# Patient Record
Sex: Male | Born: 1975 | Race: White | Hispanic: No | Marital: Single | Smoking: Current every day smoker
Health system: Southern US, Community
[De-identification: ages and names within clinical notes are randomized; demographics above are authoritative.]

## PROBLEM LIST (undated history)

## (undated) DIAGNOSIS — Z720 Tobacco use: Secondary | ICD-10-CM

## (undated) DIAGNOSIS — I2699 Other pulmonary embolism without acute cor pulmonale: Secondary | ICD-10-CM

## (undated) HISTORY — DX: Tobacco use: Z72.0

## (undated) HISTORY — DX: Other pulmonary embolism without acute cor pulmonale: I26.99

---

## 2019-03-11 ENCOUNTER — Telehealth: Payer: Self-pay | Admitting: Internal Medicine

## 2019-03-11 ENCOUNTER — Other Ambulatory Visit: Payer: Self-pay | Admitting: Internal Medicine

## 2019-03-11 ENCOUNTER — Other Ambulatory Visit: Payer: Self-pay

## 2019-03-11 ENCOUNTER — Encounter: Payer: Self-pay | Admitting: Internal Medicine

## 2019-03-11 ENCOUNTER — Ambulatory Visit: Payer: 59 | Admitting: Internal Medicine

## 2019-03-11 VITALS — BP 110/70 | HR 76 | Temp 97.7°F | Ht 72.0 in | Wt 256.7 lb

## 2019-03-11 DIAGNOSIS — E559 Vitamin D deficiency, unspecified: Secondary | ICD-10-CM

## 2019-03-11 DIAGNOSIS — E669 Obesity, unspecified: Secondary | ICD-10-CM | POA: Diagnosis not present

## 2019-03-11 DIAGNOSIS — I2699 Other pulmonary embolism without acute cor pulmonale: Secondary | ICD-10-CM | POA: Diagnosis not present

## 2019-03-11 DIAGNOSIS — Z720 Tobacco use: Secondary | ICD-10-CM | POA: Insufficient documentation

## 2019-03-11 LAB — CBC WITH DIFFERENTIAL/PLATELET
Basophils Absolute: 0.1 10*3/uL (ref 0.0–0.1)
Basophils Relative: 1.4 % (ref 0.0–3.0)
Eosinophils Absolute: 0.2 10*3/uL (ref 0.0–0.7)
Eosinophils Relative: 3.9 % (ref 0.0–5.0)
HCT: 45.8 % (ref 39.0–52.0)
Hemoglobin: 15.6 g/dL (ref 13.0–17.0)
Lymphocytes Relative: 35 % (ref 12.0–46.0)
Lymphs Abs: 2.1 10*3/uL (ref 0.7–4.0)
MCHC: 34.2 g/dL (ref 30.0–36.0)
MCV: 88.6 fl (ref 78.0–100.0)
Monocytes Absolute: 0.5 10*3/uL (ref 0.1–1.0)
Monocytes Relative: 7.7 % (ref 3.0–12.0)
Neutro Abs: 3.1 10*3/uL (ref 1.4–7.7)
Neutrophils Relative %: 52 % (ref 43.0–77.0)
Platelets: 139 10*3/uL — ABNORMAL LOW (ref 150.0–400.0)
RBC: 5.17 Mil/uL (ref 4.22–5.81)
RDW: 13.2 % (ref 11.5–15.5)
WBC: 6 10*3/uL (ref 4.0–10.5)

## 2019-03-11 LAB — COMPREHENSIVE METABOLIC PANEL
ALT: 48 U/L (ref 0–53)
AST: 20 U/L (ref 0–37)
Albumin: 4.4 g/dL (ref 3.5–5.2)
Alkaline Phosphatase: 75 U/L (ref 39–117)
BUN: 18 mg/dL (ref 6–23)
CO2: 26 mEq/L (ref 19–32)
Calcium: 8.7 mg/dL (ref 8.4–10.5)
Chloride: 105 mEq/L (ref 96–112)
Creatinine, Ser: 0.91 mg/dL (ref 0.40–1.50)
GFR: 90.55 mL/min (ref >60.00)
Glucose, Bld: 97 mg/dL (ref 70–99)
Potassium: 4 mEq/L (ref 3.5–5.1)
Sodium: 141 mEq/L (ref 135–145)
Total Bilirubin: 0.4 mg/dL (ref 0.2–1.2)
Total Protein: 6.5 g/dL (ref 6.0–8.3)

## 2019-03-11 LAB — LIPID PANEL
Cholesterol: 157 mg/dL (ref 0–200)
HDL: 40.5 mg/dL (ref >39.00)
LDL Cholesterol: 96 mg/dL (ref 0–99)
NonHDL: 116.73
Total CHOL/HDL Ratio: 4
Triglycerides: 103 mg/dL (ref 0.0–149.0)
VLDL: 20.6 mg/dL (ref 0.0–40.0)

## 2019-03-11 LAB — HEMOGLOBIN A1C: Hgb A1c MFr Bld: 6 % (ref 4.6–6.5)

## 2019-03-11 LAB — VITAMIN B12: Vitamin B-12: 384 pg/mL (ref 211–911)

## 2019-03-11 LAB — VITAMIN D 25 HYDROXY (VIT D DEFICIENCY, FRACTURES): VITD: 20.4 ng/mL — ABNORMAL LOW (ref 30.00–100.00)

## 2019-03-11 LAB — TSH: TSH: 3.97 u[IU]/mL (ref 0.35–4.50)

## 2019-03-11 MED ORDER — ELIQUIS 5 MG PO TABS: 5.0000 mg | ORAL_TABLET | Freq: Two times a day (BID) | ORAL | 2 refills | Status: DC

## 2019-03-11 MED ORDER — BUPROPION HCL ER (XL) 150 MG PO TB24: 150.0000 mg | ORAL_TABLET | Freq: Every day | ORAL | 2 refills | Status: DC

## 2019-03-11 MED ORDER — VITAMIN D (ERGOCALCIFEROL) 1.25 MG (50000 UNIT) PO CAPS: 50000.0000 [IU] | ORAL_CAPSULE | ORAL | 0 refills | Status: AC

## 2019-03-11 NOTE — Telephone Encounter (Signed)
Left detailed message on machine for patient that samples are available and coupons. Prior Auth will be started.

## 2019-03-11 NOTE — Telephone Encounter (Signed)
Patient needs PA on Eloquis. Insurance won't cover it.  He also wants Dr. Jerilee Hoh to know the original perscriber did not write it for the amount they said they would so he only has enough left to last until this Friday 03/13/19. With that said he's like a call from the nurse to get advice on what he is supposed to do without this medication because he was told it's urgent he is on the medication without interruption.

## 2019-03-11 NOTE — Progress Notes (Signed)
New Patient Office Visit     CC/Reason for Visit: Establish care, discuss medical issues Previous PCP: Unknown Last Visit: None recent  HPI: Caleb Arias is a 43 y.o. male who is coming in today for the above mentioned reasons.  He has not had routine medical care.  He is a Agricultural consultantlong-distance truck driver and last week had an episode of mild chest pain, shortness of breath and dizziness while driving which prompted a visit to a hospital in [place].  I do not have full hospital records, but he does bring in a scanned report on his cell phone that denotes the diagnosis of PE.  He was started on Eliquis 10 mg twice daily for 7 days, he is on day #6, with plans to transition to 5 mg twice daily at day 8.  Other than this he has a history of tobacco abuse and smokes 1 to 1-1/2 packs/day and is interested in cessation.  He is not aware of any other medical history of significance.  His family history is significant for a father with diabetes and a maternal grandmother that passed away years ago due to unknown primary cancer.  He has no acute complaints today.   Past Medical/Surgical History: Past Medical History:  Diagnosis Date  . Pulmonary emboli (HCC)   . Tobacco abuse     History reviewed. No pertinent surgical history.  Social History:  reports that he has been smoking cigarettes. He has been smoking about 1.50 packs per day. He has never used smokeless tobacco. He reports current alcohol use. No history on file for drug.  Allergies: No Known Allergies  Family History:  Father with diabetes  Current Outpatient Medications:  .  buPROPion (WELLBUTRIN XL) 150 MG 24 hr tablet, Take 1 tablet (150 mg total) by mouth daily., Disp: 30 tablet, Rfl: 2 .  ELIQUIS 5 MG TABS tablet, Take 1 tablet (5 mg total) by mouth 2 (two) times daily., Disp: 60 tablet, Rfl: 2  Review of Systems:  Constitutional: Denies fever, chills, diaphoresis, appetite change and fatigue.  HEENT: Denies photophobia, eye  pain, redness, hearing loss, ear pain, congestion, sore throat, rhinorrhea, sneezing, mouth sores, trouble swallowing, neck pain, neck stiffness and tinnitus.   Respiratory: Denies SOB, DOE, cough, chest tightness,  and wheezing.   Cardiovascular: Denies chest pain, palpitations and leg swelling.  Gastrointestinal: Denies nausea, vomiting, abdominal pain, diarrhea, constipation, blood in stool and abdominal distention.  Genitourinary: Denies dysuria, urgency, frequency, hematuria, flank pain and difficulty urinating.  Endocrine: Denies: hot or cold intolerance, sweats, changes in hair or nails, polyuria, polydipsia. Musculoskeletal: Denies myalgias, back pain, joint swelling, arthralgias and gait problem.  Skin: Denies pallor, rash and wound.  Neurological: Denies dizziness, seizures, syncope, weakness, light-headedness, numbness and headaches.  Hematological: Denies adenopathy. Easy bruising, personal or family bleeding history  Psychiatric/Behavioral: Denies suicidal ideation, mood changes, confusion, nervousness, sleep disturbance and agitation    Physical Exam: Vitals:   03/11/19 0656  BP: 110/70  Pulse: 76  Temp: 97.7 F (36.5 C)  TempSrc: Temporal  SpO2: 96%  Weight: 256 lb 11.2 oz (116.4 kg)  Height: 6' (1.829 m)   Body mass index is 34.81 kg/m.  Constitutional: NAD, calm, comfortable Eyes: PERRL, lids and conjunctivae normal ENMT: Mucous membranes are moist.  Neck: normal, supple, no masses, no thyromegaly Respiratory: clear to auscultation bilaterally, no wheezing, no crackles. Normal respiratory effort. No accessory muscle use.  Cardiovascular: Regular rate and rhythm, no murmurs / rubs / gallops. No  extremity edema. 2+ pedal pulses. No carotid bruits.  Abdomen: no tenderness, no masses palpated. No hepatosplenomegaly. Bowel sounds positive.  Musculoskeletal: no clubbing / cyanosis. No joint deformity upper and lower extremities. Good ROM, no contractures. Normal muscle  tone.  Skin: no rashes, lesions, ulcers. No induration Neurologic: Grossly intact and nonfocal  psychiatric: Normal judgment and insight. Alert and oriented x 3. Normal mood.    Impression and Plan:  Pulmonary embolism and infarction [facility])  -This was a provoked episode: Tobacco abuse, obesity and sedentary job (long-distance truck driving) or his risk factors. -Agree with scaling down Eliquis dose to 5 mg twice daily on day 8, refills have been given today. -He will be on blood thinners for a minimum of 12 months.  Tobacco abuse  I have discussed tobacco cessation with the patient.  I have counseled the patient regarding the negative impacts of continued tobacco use including but not limited to lung cancer, COPD, and cardiovascular disease.  I have discussed alternatives to tobacco and modalities that may help facilitate tobacco cessation including but not limited to biofeedback, hypnosis, and medications.  Total time spent with tobacco counseling was 4 minutes. -He has agreed to start Wellbutrin, we will follow-up with him in 6 months, CBT sessions have been recommended and he will consider.   Obesity (BMI 30.0-34.9)  -Discussed healthy lifestyle, including increased physical activity and better food choices to promote weight loss.     Patient Instructions  -Nice meeting you today!!  -Lab work today; will notify you once results are available.  START Wellbutrin 150 mg daily for smoking cessation.  At day 8, drop Eliquis dose to 5 mg twice a day.  -Schedule follow up in 6 weeks.  -Consider arranging CBT with Dennison Bulla in our office.     Caleb Frohlich, MD [facility]

## 2019-03-11 NOTE — Telephone Encounter (Signed)
Pt returned call.  Please call pt:  [phone]

## 2019-03-11 NOTE — Patient Instructions (Signed)
-  Nice meeting you today!!  -Lab work today; will notify you once results are available.  START Wellbutrin 150 mg daily for smoking cessation.  At day 8, drop Eliquis dose to 5 mg twice a day.  -Schedule follow up in 6 weeks.  -Consider arranging CBT with Dennison Bulla in our office.

## 2019-03-11 NOTE — Telephone Encounter (Signed)
Spoke with patient and he spoke with his insurance company and a 30 day was approved.  Patient will call back next month if a prior auth is needed.

## 2019-03-12 ENCOUNTER — Telehealth: Payer: Self-pay | Admitting: Internal Medicine

## 2019-03-12 NOTE — Telephone Encounter (Signed)
Copied from [place] [id number]. Topic: General - Other >> Mar 12, 2019  2:47 PM Arias, Caleb wrote: Reason for CRM: Pt would like to know if the clearance form for him to return back to work was received. Pt requests call back.

## 2019-03-13 NOTE — Telephone Encounter (Signed)
Letter sent to Mychart and patient is aware.

## 2019-03-13 NOTE — Telephone Encounter (Signed)
Please call pt back about whether the form was received

## 2019-03-16 ENCOUNTER — Encounter: Payer: Self-pay | Admitting: Internal Medicine

## 2019-04-16 ENCOUNTER — Encounter: Payer: Self-pay | Admitting: Internal Medicine

## 2019-04-16 ENCOUNTER — Ambulatory Visit: Payer: 59 | Admitting: Internal Medicine

## 2019-04-16 ENCOUNTER — Other Ambulatory Visit: Payer: Self-pay

## 2019-04-16 VITALS — BP 130/90 | HR 72 | Temp 98.8°F | Wt 256.0 lb

## 2019-04-16 DIAGNOSIS — Z23 Encounter for immunization: Secondary | ICD-10-CM | POA: Diagnosis not present

## 2019-04-16 DIAGNOSIS — R079 Chest pain, unspecified: Secondary | ICD-10-CM

## 2019-04-16 DIAGNOSIS — R002 Palpitations: Secondary | ICD-10-CM | POA: Diagnosis not present

## 2019-04-16 NOTE — Patient Instructions (Signed)
-  Nice seeing you today!!  -We will arrange for you to see cardiology.

## 2019-04-16 NOTE — Progress Notes (Signed)
Established Patient Office Visit     CC/Reason for Visit: Episode of palpitations and lightheadedness  HPI: Caleb Arias is a 43 y.o. male who is coming in today for the above mentioned reasons. Past Medical History is significant for: Obesity, tobacco abuse and a right upper lobe pulmonary embolism diagnosed in August who is on Eliquis.  I met him 3 weeks ago.  At that time he was started on Wellbutrin for smoking cessation and has been able to cut back from 2 packs a day to 10 cigarettes a week.  Yesterday he was at home watching TV when suddenly he had mild left precordial discomfort without radiation.  He had palpitations and felt dizzy when he took his heart rate it was around 1 25-1 30.  Denies shortness of breath, cough.  He went to walk his dog thinking that that would help.  When he returned and he was still having the palpitations he decided to call 911.  911 did not recommend hospital transport.  He brings in his strips that show sinus tachycardia with a rate of 107 but otherwise no acute changes.  He states he has had 3 of these episodes in the last 3 months or so.  He takes his Eliquis religiously at 9:00 in the morning and 9 in the evening.   Past Medical/Surgical History: Past Medical History:  Diagnosis Date  . Pulmonary emboli ([place])   . Tobacco abuse     No past surgical history on file.  Social History:  reports that he has been smoking cigarettes. He has been smoking about 1.50 packs per day. He has never used smokeless tobacco. He reports current alcohol use. No history on file for drug.  Allergies: No Known Allergies  Family History:  No history of heart disease, cancer, stroke that he is aware of.  Current Outpatient Medications:  .  buPROPion (WELLBUTRIN XL) 150 MG 24 hr tablet, Take 1 tablet (150 mg total) by mouth daily., Disp: 30 tablet, Rfl: 2 .  ELIQUIS 5 MG TABS tablet, Take 1 tablet (5 mg total) by mouth 2 (two) times daily., Disp: 60 tablet, Rfl:  2 .  Vitamin D, Ergocalciferol, (DRISDOL) 1.25 MG (50000 UT) CAPS capsule, Take 1 capsule (50,000 Units total) by mouth every 7 (seven) days for 12 doses., Disp: 12 capsule, Rfl: 0  Review of Systems:  Constitutional: Denies fever, chills, diaphoresis, appetite change and fatigue.  HEENT: Denies photophobia, eye pain, redness, hearing loss, ear pain, congestion, sore throat, rhinorrhea, sneezing, mouth sores, trouble swallowing, neck pain, neck stiffness and tinnitus.   Respiratory: Denies SOB, DOE, cough, chest tightness,  and wheezing.   Cardiovascular: Denies leg swelling.  Gastrointestinal: Denies nausea, vomiting, abdominal pain, diarrhea, constipation, blood in stool and abdominal distention.  Genitourinary: Denies dysuria, urgency, frequency, hematuria, flank pain and difficulty urinating.  Endocrine: Denies: hot or cold intolerance, sweats, changes in hair or nails, polyuria, polydipsia. Musculoskeletal: Denies myalgias, back pain, joint swelling, arthralgias and gait problem.  Skin: Denies pallor, rash and wound.  Neurological: Denies dizziness, seizures, syncope, weakness,  numbness and headaches.  Hematological: Denies adenopathy. Easy bruising, personal or family bleeding history  Psychiatric/Behavioral: Denies suicidal ideation, mood changes, confusion, nervousness, sleep disturbance and agitation    Physical Exam: Vitals:   04/16/19 1534  BP: 130/90  Pulse: 72  Temp: 98.8 F (37.1 C)  TempSrc: Temporal  SpO2: 95%  Weight: 256 lb (116.1 kg)    Body mass index is 34.72 kg/m.  Constitutional: NAD, calm, comfortable Eyes: PERRL, lids and conjunctivae normal ENMT: Mucous membranes are moist.  Respiratory: clear to auscultation bilaterally, no wheezing, no crackles. Normal respiratory effort. No accessory muscle use.  Cardiovascular: Regular rate and rhythm, no murmurs / rubs / gallops. No extremity edema. 2+ pedal pulses. No carotid bruits.  Abdomen: no tenderness,  no masses palpated. No hepatosplenomegaly. Bowel sounds positive.  Musculoskeletal: no clubbing / cyanosis. No joint deformity upper and lower extremities. Good ROM, no contractures. Normal muscle tone.  Neurologic: CN 2-12 grossly intact. Sensation intact, DTR normal. Strength 5/5 in all 4.  Psychiatric: Normal judgment and insight. Alert and oriented x 3. Normal mood.    Impression and Plan:  Chest pain, unspecified type  Palpitations  -EKG done in office today and interpreted by myself as: Normal sinus rhythm with a rate of 65, normal axis, no acute ST or T wave changes. -With the episodes of palpitations and documented elevated heart rate, I think it would be worthwhile to consider an event monitor to evaluate for arrhythmias.  He is also obese and with significant tobacco history so coronary artery disease is also possible. -Will initiate cardiology referral today. -He is currently chest pain-free.    Patient Instructions  -Nice seeing you today!!  -We will arrange for you to see cardiology.       Lelon Frohlich, MD [facility] at [facility]

## 2019-04-20 ENCOUNTER — Encounter: Payer: Self-pay | Admitting: Internal Medicine

## 2019-04-21 ENCOUNTER — Emergency Department (HOSPITAL_COMMUNITY): Payer: 59

## 2019-04-21 ENCOUNTER — Encounter (HOSPITAL_COMMUNITY): Payer: Self-pay

## 2019-04-21 ENCOUNTER — Emergency Department (HOSPITAL_COMMUNITY)
Admission: EM | Admit: 2019-04-21 | Discharge: 2019-04-22 | Disposition: A | Discharge status: Home / Self Care | Payer: 59 | Attending: Emergency Medicine | Admitting: Emergency Medicine

## 2019-04-21 DIAGNOSIS — Z79899 Other long term (current) drug therapy: Secondary | ICD-10-CM | POA: Diagnosis not present

## 2019-04-21 DIAGNOSIS — F17210 Nicotine dependence, cigarettes, uncomplicated: Secondary | ICD-10-CM | POA: Insufficient documentation

## 2019-04-21 DIAGNOSIS — Z7901 Long term (current) use of anticoagulants: Secondary | ICD-10-CM | POA: Insufficient documentation

## 2019-04-21 DIAGNOSIS — R0789 Other chest pain: Secondary | ICD-10-CM | POA: Diagnosis not present

## 2019-04-21 DIAGNOSIS — R0602 Shortness of breath: Secondary | ICD-10-CM | POA: Diagnosis present

## 2019-04-21 LAB — I-STAT CREATININE, ED: Creatinine, Ser: 1 mg/dL (ref 0.61–1.24)

## 2019-04-21 LAB — CBC WITH DIFFERENTIAL/PLATELET
Abs Immature Granulocytes: 0.01 10*3/uL (ref 0.00–0.07)
Basophils Absolute: 0.1 10*3/uL (ref 0.0–0.1)
Basophils Relative: 1 %
Eosinophils Absolute: 0.2 10*3/uL (ref 0.0–0.5)
Eosinophils Relative: 3 %
HCT: 45.2 % (ref 39.0–52.0)
Hemoglobin: 16.1 g/dL (ref 13.0–17.0)
Immature Granulocytes: 0 %
Lymphocytes Relative: 30 %
Lymphs Abs: 2.3 10*3/uL (ref 0.7–4.0)
MCH: 31.2 pg (ref 26.0–34.0)
MCHC: 35.6 g/dL (ref 30.0–36.0)
MCV: 87.6 fL (ref 80.0–100.0)
Monocytes Absolute: 0.4 10*3/uL (ref 0.1–1.0)
Monocytes Relative: 5 %
Neutro Abs: 4.7 10*3/uL (ref 1.7–7.7)
Neutrophils Relative %: 61 %
Platelets: 184 10*3/uL (ref 150–400)
RBC: 5.16 MIL/uL (ref 4.22–5.81)
RDW: 12.4 % (ref 11.5–15.5)
WBC: 7.7 10*3/uL (ref 4.0–10.5)
nRBC: 0 % (ref 0.0–0.2)

## 2019-04-21 LAB — BASIC METABOLIC PANEL
Anion gap: 8 (ref 5–15)
BUN: 17 mg/dL (ref 6–20)
CO2: 24 mmol/L (ref 22–32)
Calcium: 8.7 mg/dL — ABNORMAL LOW (ref 8.9–10.3)
Chloride: 103 mmol/L (ref 98–111)
Creatinine, Ser: 1.06 mg/dL (ref 0.61–1.24)
GFR calc Af Amer: >60 mL/min (ref >60)
GFR calc non Af Amer: >60 mL/min (ref >60)
Glucose, Bld: 124 mg/dL — ABNORMAL HIGH (ref 70–99)
Potassium: 5.7 mmol/L — ABNORMAL HIGH (ref 3.5–5.1)
Sodium: 135 mmol/L (ref 135–145)

## 2019-04-21 LAB — BRAIN NATRIURETIC PEPTIDE: B Natriuretic Peptide: 18.7 pg/mL (ref 0.0–100.0)

## 2019-04-21 LAB — TROPONIN I (HIGH SENSITIVITY): Troponin I (High Sensitivity): 3 ng/L (ref <18)

## 2019-04-21 MED ORDER — IOHEXOL 350 MG/ML SOLN
75.0000 mL | Freq: Once | INTRAVENOUS | Status: AC | PRN
  Administered 2019-04-21: 75 mL via INTRAVENOUS

## 2019-04-21 MED ORDER — ALUM & MAG HYDROXIDE-SIMETH 200-200-20 MG/5ML PO SUSP
30.0000 mL | Freq: Once | ORAL | Status: AC
  Administered 2019-04-22: 30 mL via ORAL
  Filled 2019-04-21: qty 30

## 2019-04-21 NOTE — ED Provider Notes (Signed)
[facility] EMERGENCY DEPARTMENT Provider Note   CSN: [id number] Arrival date & time: 04/21/19  2151     History   Chief Complaint Chief Complaint  Patient presents with   Shortness of Breath    HPI Caleb Arias is a 43 y.o. male.     43 yo M with a chief complaints of chest discomfort.  He states this comes in waves.  He feels a wave buildup in the upper part of his abdomen and go up to his entire chest.  Usually lasts for about an hour he feels it going to both of his arms and he has trouble breathing with this.  He has had about 4 episodes of this.  Usually resolves when EMS arrived.  He went to a hospital in [facility] and was diagnosed with a pulmonary embolism.  They told him it was a small 1 and started him on anticoagulation.  He has been taking Eliquis regularly.  Unfortunately over the past week or so he has had these more frequently.  States they seem to happen about once every night somewhere around 9 PM.  Usually is at rest when these occur.  He had another episode this evening that concerned him and so he came to the emergency department.  Feels that his symptoms have resolved though he has a little bit of a mild ache to the left side of his chest.  The ache has not been there previously.  He denies any exertional symptoms.  Denies history of MI.  The history is provided by the patient.  Shortness of Breath Severity:  Moderate Onset quality:  Gradual Duration:  1 month Timing:  Intermittent Progression:  Waxing and waning Chronicity:  New Relieved by:  Nothing Worsened by:  Nothing Ineffective treatments:  None tried Associated symptoms: chest pain   Associated symptoms: no abdominal pain, no fever, no headaches, no rash and no vomiting     Past Medical History:  Diagnosis Date   Pulmonary emboli (HCC)    Tobacco abuse     Patient Active Problem List   Diagnosis Date Noted   Obesity (BMI 30.0-34.9) 03/11/2019   Vitamin D  deficiency 03/11/2019   Tobacco abuse     No past surgical history on file.      Home Medications    Prior to Admission medications   Medication Sig Start Date End Date Taking? Authorizing Provider  buPROPion (WELLBUTRIN XL) 150 MG 24 hr tablet Take 1 tablet (150 mg total) by mouth daily. 03/11/19   Philip Aspen, Limmie Patricia, MD  ELIQUIS 5 MG TABS tablet Take 1 tablet (5 mg total) by mouth 2 (two) times daily. 03/11/19   Philip Aspen, Limmie Patricia, MD  Vitamin D, Ergocalciferol, (DRISDOL) 1.25 MG (50000 UT) CAPS capsule Take 1 capsule (50,000 Units total) by mouth every 7 (seven) days for 12 doses. 03/11/19 05/28/19  Henderson Cloud, MD    Family History No family history on file.  Social History Social History   Tobacco Use   Smoking status: Current Every Day Smoker    Packs/day: 1.50    Types: Cigarettes   Smokeless tobacco: Never Used  Substance Use Topics   Alcohol use: Yes   Drug use: Not on file     Allergies   Patient has no known allergies.   Review of Systems Review of Systems  Constitutional: Negative for chills and fever.  HENT: Negative for congestion and facial swelling.   Eyes: Negative for  discharge and visual disturbance.  Respiratory: Positive for shortness of breath.   Cardiovascular: Positive for chest pain. Negative for palpitations.  Gastrointestinal: Negative for abdominal pain, diarrhea and vomiting.  Musculoskeletal: Negative for arthralgias and myalgias.  Skin: Negative for color change and rash.  Neurological: Negative for tremors, syncope and headaches.  Psychiatric/Behavioral: Negative for confusion and dysphoric mood.     Physical Exam Updated Vital Signs BP (!) 161/74    Pulse (!) 49    Temp 98.3 F (36.8 C) (Oral)    Resp (!) 22    Ht 5\' 11"  (1.803 m)    Wt 115.7 kg    SpO2 97%    BMI 35.57 kg/m   Physical Exam Vitals signs and nursing note reviewed.  Constitutional:      Appearance: He is well-developed.    HENT:     Head: Normocephalic and atraumatic.  Eyes:     Pupils: Pupils are equal, round, and reactive to light.  Neck:     Musculoskeletal: Normal range of motion and neck supple.     Vascular: No JVD.  Cardiovascular:     Rate and Rhythm: Normal rate and regular rhythm.     Heart sounds: No murmur. No friction rub. No gallop.   Pulmonary:     Effort: No respiratory distress.     Breath sounds: No wheezing.  Abdominal:     General: There is no distension.     Tenderness: There is no guarding or rebound.  Musculoskeletal: Normal range of motion.  Skin:    Coloration: Skin is not pale.     Findings: No rash.  Neurological:     Mental Status: He is alert and oriented to person, place, and time.  Psychiatric:        Behavior: Behavior normal.      ED Treatments / Results  Labs (all labs ordered are listed, but only abnormal results are displayed) Labs Reviewed  BASIC METABOLIC PANEL - Abnormal; Notable for the following components:      Result Value   Potassium 5.7 (*)    Glucose, Bld 124 (*)    Calcium 8.7 (*)    All other components within normal limits  CBC WITH DIFFERENTIAL/PLATELET  BRAIN NATRIURETIC PEPTIDE  POTASSIUM  I-STAT CREATININE, ED  TROPONIN I (HIGH SENSITIVITY)  TROPONIN I (HIGH SENSITIVITY)    EKG EKG Interpretation  Date/Time:  Tuesday April 21 2019 21:57:36 EDT Ventricular Rate:  84 PR Interval:    QRS Duration: 102 QT Interval:  368 QTC Calculation: 435 R Axis:   68 Text Interpretation:  Sinus rhythm No old tracing to compare Confirmed by Deno Etienne [phone]) on 04/21/2019 10:21:10 PM   Radiology Dg Chest 2 View  Result Date: 04/21/2019 CLINICAL DATA:  43 year old male with shortness of breath and chest pain. EXAM: CHEST - 2 VIEW COMPARISON:  None. FINDINGS: The heart size and mediastinal contours are within normal limits. Both lungs are clear. The visualized skeletal structures are unremarkable. IMPRESSION: No active cardiopulmonary  disease. Electronically Signed   By: Anner Crete M.D.   On: 04/21/2019 22:47   Ct Angio Chest Pe W And/or Wo Contrast  Result Date: 04/21/2019 CLINICAL DATA:  43 year old male with shortness of breath and chest discomfort. EXAM: CT ANGIOGRAPHY CHEST WITH CONTRAST TECHNIQUE: Multidetector CT imaging of the chest was performed using the standard protocol during bolus administration of intravenous contrast. Multiplanar CT image reconstructions and MIPs were obtained to evaluate the vascular anatomy. CONTRAST:  19mL OMNIPAQUE  IOHEXOL 350 MG/ML SOLN COMPARISON:  Chest radiograph dated 09/22 FINDINGS: Cardiovascular: There is no cardiomegaly or pericardial effusion. The thoracic aorta is unremarkable. There is no CT evidence of pulmonary embolism. Mediastinum/Nodes: No hilar or mediastinal adenopathy. The esophagus and the thyroid gland are grossly unremarkable. No mediastinal fluid collection. Lungs/Pleura: No focal consolidation, pleural effusion, pneumothorax. The central airways are patent. Upper Abdomen: Diffuse fatty infiltration of the liver. The visualized upper abdomen is otherwise unremarkable. Musculoskeletal: No chest wall abnormality. No acute or significant osseous findings. Review of the MIP images confirms the above findings. IMPRESSION: No acute intrathoracic pathology. No CT evidence of pulmonary embolism. Electronically Signed   By: Elgie Collard M.D.   On: 04/21/2019 23:15    Procedures Procedures (including critical care time)  Medications Ordered in ED Medications  alum & mag hydroxide-simeth (MAALOX/MYLANTA) 200-200-20 MG/5ML suspension 30 mL (has no administration in time range)  iohexol (OMNIPAQUE) 350 MG/ML injection 75 mL (75 mLs Intravenous Contrast Given 04/21/19 2247)     Initial Impression / Assessment and Plan / ED Course  I have reviewed the triage vital signs and the nursing notes.  Pertinent labs & imaging results that were available during my care of the  patient were reviewed by me and considered in my medical decision making (see chart for details).        60 y oM with a chief complaints of chest discomfort.  This is atypical of ACS and frankly is atypical of a pulmonary embolism history.  He has a diagnosis of a PE.  Is been on Eliquis.  Denies any missed doses.  He is well-appearing nontoxic is not tachycardic or hypoxic.  He has pain now on the left side when he was diagnosed with a PE on the right.  I will obtain a CT scan to assess for increased clot burden.  Risk stratify with a troponin and BNP.  Troponin is negative BNP is normal.  Patient has no leukocytosis is not anemic.  CT scan of the chest is negative for pulmonary embolism.  His potassium is mildly elevated though appears to be hemolyzed sample.  I discussed the results with the patient.  Will give dose of Maalox.  Delta troponin.  Repeat potassium.   Patient CARE signed out to Dr. Elesa Massed.  Please see their note for further details care in the ED.  The patients results and plan were reviewed and discussed.   Any x-rays performed were independently reviewed by myself.   Differential diagnosis were considered with the presenting HPI.  Medications  alum & mag hydroxide-simeth (MAALOX/MYLANTA) 200-200-20 MG/5ML suspension 30 mL (has no administration in time range)  iohexol (OMNIPAQUE) 350 MG/ML injection 75 mL (75 mLs Intravenous Contrast Given 04/21/19 2247)    Vitals:   04/21/19 2154 04/21/19 2158 04/21/19 2200 04/21/19 2230  BP:  122/80 127/80 (!) 161/74  Pulse:  87 82 (!) 49  Resp:  15 (!) 24 (!) 22  Temp:  98.3 F (36.8 C)    TempSrc:  Oral    SpO2:  98% 98% 97%  Weight: 115.7 kg     Height: 5\' 11"  (1.803 m)       Final diagnoses:  Atypical chest pain       Final Clinical Impressions(s) / ED Diagnoses   Final diagnoses:  Atypical chest pain    ED Discharge Orders    None       , DO 04/22/19 0001

## 2019-04-21 NOTE — ED Triage Notes (Signed)
Pt BIB EMS from home with SOB & chest discomfort. Diagnosed with PE a month ago and has been taking Eliquis (also took today) for it. VS stable with EMS: 120/90 BP, 98% RA, 115 CBG

## 2019-04-21 NOTE — ED Notes (Signed)
Patient transported to X-ray 

## 2019-04-21 NOTE — ED Provider Notes (Signed)
11:49 PM  Assumed care from Dr. Tyrone Nine.  Patient is a 43 y.o. M who presents to ED with atypical chest pain.  Has previously had a subsegmental PE.  First troponin negative.  Potassium hemolyzed at 5.7.  Awaiting second troponin and repeat potassium.  CT PE study shows no PE or other acute abnormality.  Plan is for discharge a second troponin, potassium normal.  2:00 AM  Pt's second troponin negative.  K+ normal.  Has follow up with PCP today.  Will dc home.  Remains HD stable.   At this time, I do not feel there is any life-threatening condition present. I have reviewed and discussed all results (EKG, imaging, lab, urine as appropriate) and exam findings with patient/family. I have reviewed nursing notes and appropriate previous records.  I feel the patient is safe to be discharged home without further emergent workup and can continue workup as an outpatient as needed. Discussed usual and customary return precautions. Patient/family verbalize understanding and are comfortable with this plan.  Outpatient follow-up has been provided as needed. All questions have been answered.    Artie Takayama, Delice Bison, DO 04/22/19 0202

## 2019-04-22 ENCOUNTER — Ambulatory Visit (INDEPENDENT_AMBULATORY_CARE_PROVIDER_SITE_OTHER): Payer: 59 | Admitting: Internal Medicine

## 2019-04-22 ENCOUNTER — Encounter: Payer: Self-pay | Admitting: Internal Medicine

## 2019-04-22 ENCOUNTER — Other Ambulatory Visit: Payer: Self-pay

## 2019-04-22 VITALS — BP 110/80 | HR 80 | Temp 98.1°F | Ht 71.5 in | Wt 251.1 lb

## 2019-04-22 DIAGNOSIS — Z0000 Encounter for general adult medical examination without abnormal findings: Secondary | ICD-10-CM

## 2019-04-22 DIAGNOSIS — Z720 Tobacco use: Secondary | ICD-10-CM | POA: Diagnosis not present

## 2019-04-22 DIAGNOSIS — E785 Hyperlipidemia, unspecified: Secondary | ICD-10-CM

## 2019-04-22 DIAGNOSIS — E669 Obesity, unspecified: Secondary | ICD-10-CM

## 2019-04-22 DIAGNOSIS — R7302 Impaired glucose tolerance (oral): Secondary | ICD-10-CM

## 2019-04-22 DIAGNOSIS — E66811 Obesity, class 1: Secondary | ICD-10-CM

## 2019-04-22 DIAGNOSIS — E559 Vitamin D deficiency, unspecified: Secondary | ICD-10-CM

## 2019-04-22 LAB — TROPONIN I (HIGH SENSITIVITY): Troponin I (High Sensitivity): 2 ng/L (ref <18)

## 2019-04-22 LAB — POTASSIUM: Potassium: 3.3 mmol/L — ABNORMAL LOW (ref 3.5–5.1)

## 2019-04-22 NOTE — Patient Instructions (Signed)
-Nice seeing you today!!  -Remember to schedule an eye exam.  -Schedule 4 month follow up.   Preventive Care 40-44 Years Old, Male Preventive care refers to lifestyle choices and visits with your health care provider that can promote health and wellness. This includes:  A yearly physical exam. This is also called an annual well check.  Regular dental and eye exams.  Immunizations.  Screening for certain conditions.  Healthy lifestyle choices, such as eating a healthy diet, getting regular exercise, not using drugs or products that contain nicotine and tobacco, and limiting alcohol use. What can I expect for my preventive care visit? Physical exam Your health care provider will check:  Height and weight. These may be used to calculate body mass index (BMI), which is a measurement that tells if you are at a healthy weight.  Heart rate and blood pressure.  Your skin for abnormal spots. Counseling Your health care provider may ask you questions about:  Alcohol, tobacco, and drug use.  Emotional well-being.  Home and relationship well-being.  Sexual activity.  Eating habits.  Work and work Statistician. What immunizations do I need?  Influenza (flu) vaccine  This is recommended every year. Tetanus, diphtheria, and pertussis (Tdap) vaccine  You may need a Td booster every 10 years. Varicella (chickenpox) vaccine  You may need this vaccine if you have not already been vaccinated. Zoster (shingles) vaccine  You may need this after age 36. Measles, mumps, and rubella (MMR) vaccine  You may need at least one dose of MMR if you were born in 1957 or later. You may also need a second dose. Pneumococcal conjugate (PCV13) vaccine  You may need this if you have certain conditions and were not previously vaccinated. Pneumococcal polysaccharide (PPSV23) vaccine  You may need one or two doses if you smoke cigarettes or if you have certain conditions. Meningococcal  conjugate (MenACWY) vaccine  You may need this if you have certain conditions. Hepatitis A vaccine  You may need this if you have certain conditions or if you travel or work in places where you may be exposed to hepatitis A. Hepatitis B vaccine  You may need this if you have certain conditions or if you travel or work in places where you may be exposed to hepatitis B. Haemophilus influenzae type b (Hib) vaccine  You may need this if you have certain risk factors. Human papillomavirus (HPV) vaccine  If recommended by your health care provider, you may need three doses over 6 months. You may receive vaccines as individual doses or as more than one vaccine together in one shot (combination vaccines). Talk with your health care provider about the risks and benefits of combination vaccines. What tests do I need? Blood tests  Lipid and cholesterol levels. These may be checked every 5 years, or more frequently if you are over 5 years old.  Hepatitis C test.  Hepatitis B test. Screening  Lung cancer screening. You may have this screening every year starting at age 22 if you have a 30-pack-year history of smoking and currently smoke or have quit within the past 15 years.  Prostate cancer screening. Recommendations will vary depending on your family history and other risks.  Colorectal cancer screening. All adults should have this screening starting at age 34 and continuing until age 92. Your health care provider may recommend screening at age 70 if you are at increased risk. You will have tests every 1-10 years, depending on your results and the type of  screening test.  Diabetes screening. This is done by checking your blood sugar (glucose) after you have not eaten for a while (fasting). You may have this done every 1-3 years.  Sexually transmitted disease (STD) testing. Follow these instructions at home: Eating and drinking  Eat a diet that includes fresh fruits and vegetables, whole  grains, lean protein, and low-fat dairy products.  Take vitamin and mineral supplements as recommended by your health care provider.  Do not drink alcohol if your health care provider tells you not to drink.  If you drink alcohol: ? Limit how much you have to 0-2 drinks a day. ? Be aware of how much alcohol is in your drink. In the [place] one drink equals one 12 oz bottle of beer (355 mL), one 5 oz glass of wine (148 mL), or one 1 oz glass of hard liquor (44 mL). Lifestyle  Take daily care of your teeth and gums.  Stay active. Exercise for at least 30 minutes on 5 or more days each week.  Do not use any products that contain nicotine or tobacco, such as cigarettes, e-cigarettes, and chewing tobacco. If you need help quitting, ask your health care provider.  If you are sexually active, practice safe sex. Use a condom or other form of protection to prevent STIs (sexually transmitted infections).  Talk with your health care provider about taking a low-dose aspirin every day starting at age 11. What's next?  Go to your health care provider once a year for a well check visit.  Ask your health care provider how often you should have your eyes and teeth checked.  Stay up to date on all vaccines. This information is not intended to replace advice given to you by your health care provider. Make sure you discuss any questions you have with your health care provider. Document Released: 08/12/2015 Document Revised: 07/10/2018 Document Reviewed: 07/10/2018 Elsevier Patient Education  2020 [employer].

## 2019-04-22 NOTE — Discharge Instructions (Signed)
It is unlikely that you are having a heart attack based on your blood work and ecg. Please follow up with your doctor.  As discussed one of the more common causes of chest pain is reflux.    Try zantac or pepcid twice a day.  Try to avoid things that may make this worse, most commonly these are spicy foods tomato based products fatty foods chocolate and peppermint.  Alcohol and tobacco can also make this worse.  Return to the emergency department for sudden worsening pain fever or inability to eat or drink.

## 2019-04-22 NOTE — ED Notes (Signed)
Spoke with main lab about Potassium result. Per lab they will add it on to last collection.

## 2019-04-22 NOTE — ED Notes (Signed)
Patient verbalizes understanding of discharge instructions. Opportunity for questioning and answers were provided. Armband removed by staff, pt discharged from ED to home in wheelchair.   

## 2019-04-22 NOTE — Progress Notes (Signed)
Established Patient Office Visit     CC/Reason for Visit: Annual preventive exam  HPI: Caleb Arias is a 43 y.o. male who is coming in today for the above mentioned reasons. Past Medical History is significant for: Obesity with some moderate weight loss in the last 6 weeks, Hyperlipidemia not on medications, impaired glucose tolerance with an A1c of 6.0 in August, vitamin D deficiency, right-sided pulmonary embolism in August of this year anticoagulated on Eliquis, as well as tobacco abuse with recent significant decrease in nicotine intake following start of Wellbutrin..  I saw him on September 17 after an episode of chest discomfort.  He feels like his heart starts to race and he has left-sided precordial pain not related to exertion or activity that resolves spontaneously.  He walks his dog 3 times a day and does not have any chest discomfort with this. He had another 1 of these episodes requiring ED visit last night.  He was discharged home after a negative work-up.  He already has appointment scheduled with cardiology in 2 days.  His cousin on his father's side had a fatal MI at age 56, otherwise no family history of coronary artery disease.   Past Medical/Surgical History: Past Medical History:  Diagnosis Date  . Pulmonary emboli ([place])   . Tobacco abuse     No past surgical history on file.  Social History:  reports that he has been smoking cigarettes. He has been smoking about 1.50 packs per day. He has never used smokeless tobacco. He reports current alcohol use. No history on file for drug.  Allergies: No Known Allergies  Family History:  Cousin with fatal MI at age 53  Current Outpatient Medications:  .  buPROPion (WELLBUTRIN XL) 150 MG 24 hr tablet, Take 1 tablet (150 mg total) by mouth daily., Disp: 30 tablet, Rfl: 2 .  ELIQUIS 5 MG TABS tablet, Take 1 tablet (5 mg total) by mouth 2 (two) times daily., Disp: 60 tablet, Rfl: 2 .  Vitamin D, Ergocalciferol, (DRISDOL)  1.25 MG (50000 UT) CAPS capsule, Take 1 capsule (50,000 Units total) by mouth every 7 (seven) days for 12 doses., Disp: 12 capsule, Rfl: 0  Review of Systems:  Constitutional: Denies fever, chills, diaphoresis, appetite change and fatigue.  HEENT: Denies photophobia, eye pain, redness, hearing loss, ear pain, congestion, sore throat, rhinorrhea, sneezing, mouth sores, trouble swallowing, neck pain, neck stiffness and tinnitus.   Respiratory: Denies SOB, DOE, cough,   and wheezing.   Cardiovascular: Denies palpitations and leg swelling.  Gastrointestinal: Denies nausea, vomiting, abdominal pain, diarrhea, constipation, blood in stool and abdominal distention.  Genitourinary: Denies dysuria, urgency, frequency, hematuria, flank pain and difficulty urinating.  Endocrine: Denies: hot or cold intolerance, sweats, changes in hair or nails, polyuria, polydipsia. Musculoskeletal: Denies myalgias, back pain, joint swelling, arthralgias and gait problem.  Skin: Denies pallor, rash and wound.  Neurological: Denies dizziness, seizures, syncope, weakness, light-headedness, numbness and headaches.  Hematological: Denies adenopathy. Easy bruising, personal or family bleeding history  Psychiatric/Behavioral: Denies suicidal ideation, mood changes, confusion, nervousness, sleep disturbance and agitation    Physical Exam: Vitals:   04/22/19 0831  BP: 110/80  Pulse: 80  Temp: 98.1 F (36.7 C)  TempSrc: Temporal  SpO2: 96%  Weight: 251 lb 1.6 oz (113.9 kg)  Height: 5' 11.5" (1.816 m)    Body mass index is 34.53 kg/m.   Constitutional: NAD, calm, comfortable Eyes: PERRL, lids and conjunctivae normal ENMT: Mucous membranes are moist.  Tympanic  membrane is pearly white, no erythema or bulging. Neck: normal, supple, no masses, no thyromegaly Respiratory: clear to auscultation bilaterally, no wheezing, no crackles. Normal respiratory effort. No accessory muscle use.  Cardiovascular: Regular rate and  rhythm, no murmurs / rubs / gallops. No extremity edema. 2+ pedal pulses. No carotid bruits.  Abdomen: no tenderness, no masses palpated. No hepatosplenomegaly. Bowel sounds positive.  Musculoskeletal: no clubbing / cyanosis. No joint deformity upper and lower extremities. Good ROM, no contractures. Normal muscle tone.  Skin: no rashes, lesions, ulcers. No induration Neurologic: CN 2-12 grossly intact. Sensation intact, DTR normal. Strength 5/5 in all 4.  Psychiatric: Normal judgment and insight. Alert and oriented x 3. Normal mood.    Impression and Plan:  Encounter for preventive health examination -Have advised routine eye care, he has routine dental care. -All immunizations are up-to-date and age-appropriate. -No labs today as he has had all of them done in the past 2 months. -Commence routine colon cancer screening at age 10. -Healthy lifestyle has been discussed in detail today.  IGT (impaired glucose tolerance) -A1c of 6.0 in August 2020. -Have counseled on weight loss, which he is already doing, and decreasing processed carbs in diet.  Hyperlipidemia, unspecified hyperlipidemia type -LDL of 96 in August 2020. -Not on medications. -Have advised continued lifestyle modifications which he is working on.  Tobacco abuse -He was started on Wellbutrin 6 weeks ago, has managed to cut down from 2 packs a day to 10 cigarettes a week.  Obesity (BMI 30.0-34.9) -Discussed healthy lifestyle, including increased physical activity and better food choices to promote weight loss.  Vitamin D deficiency -Is currently on high-dose vitamin D supplementation. -Recheck levels in 12 weeks.    Patient Instructions  -Nice seeing you today!!  -Remember to schedule an eye exam.  -Schedule 4 month follow up.   Preventive Care 73-9 Years Old, Male Preventive care refers to lifestyle choices and visits with your health care provider that can promote health and wellness. This includes:  A  yearly physical exam. This is also called an annual well check.  Regular dental and eye exams.  Immunizations.  Screening for certain conditions.  Healthy lifestyle choices, such as eating a healthy diet, getting regular exercise, not using drugs or products that contain nicotine and tobacco, and limiting alcohol use. What can I expect for my preventive care visit? Physical exam Your health care provider will check:  Height and weight. These may be used to calculate body mass index (BMI), which is a measurement that tells if you are at a healthy weight.  Heart rate and blood pressure.  Your skin for abnormal spots. Counseling Your health care provider may ask you questions about:  Alcohol, tobacco, and drug use.  Emotional well-being.  Home and relationship well-being.  Sexual activity.  Eating habits.  Work and work Statistician. What immunizations do I need?  Influenza (flu) vaccine  This is recommended every year. Tetanus, diphtheria, and pertussis (Tdap) vaccine  You may need a Td booster every 10 years. Varicella (chickenpox) vaccine  You may need this vaccine if you have not already been vaccinated. Zoster (shingles) vaccine  You may need this after age 78. Measles, mumps, and rubella (MMR) vaccine  You may need at least one dose of MMR if you were born in 1957 or later. You may also need a second dose. Pneumococcal conjugate (PCV13) vaccine  You may need this if you have certain conditions and were not previously vaccinated. Pneumococcal polysaccharide (PPSV23)  vaccine  You may need one or two doses if you smoke cigarettes or if you have certain conditions. Meningococcal conjugate (MenACWY) vaccine  You may need this if you have certain conditions. Hepatitis A vaccine  You may need this if you have certain conditions or if you travel or work in places where you may be exposed to hepatitis A. Hepatitis B vaccine  You may need this if you have  certain conditions or if you travel or work in places where you may be exposed to hepatitis B. Haemophilus influenzae type b (Hib) vaccine  You may need this if you have certain risk factors. Human papillomavirus (HPV) vaccine  If recommended by your health care provider, you may need three doses over 6 months. You may receive vaccines as individual doses or as more than one vaccine together in one shot (combination vaccines). Talk with your health care provider about the risks and benefits of combination vaccines. What tests do I need? Blood tests  Lipid and cholesterol levels. These may be checked every 5 years, or more frequently if you are over 27 years old.  Hepatitis C test.  Hepatitis B test. Screening  Lung cancer screening. You may have this screening every year starting at age 53 if you have a 30-pack-year history of smoking and currently smoke or have quit within the past 15 years.  Prostate cancer screening. Recommendations will vary depending on your family history and other risks.  Colorectal cancer screening. All adults should have this screening starting at age 58 and continuing until age 47. Your health care provider may recommend screening at age 69 if you are at increased risk. You will have tests every 1-10 years, depending on your results and the type of screening test.  Diabetes screening. This is done by checking your blood sugar (glucose) after you have not eaten for a while (fasting). You may have this done every 1-3 years.  Sexually transmitted disease (STD) testing. Follow these instructions at home: Eating and drinking  Eat a diet that includes fresh fruits and vegetables, whole grains, lean protein, and low-fat dairy products.  Take vitamin and mineral supplements as recommended by your health care provider.  Do not drink alcohol if your health care provider tells you not to drink.  If you drink alcohol: ? Limit how much you have to 0-2 drinks a day.  ? Be aware of how much alcohol is in your drink. In the [place] one drink equals one 12 oz bottle of beer (355 mL), one 5 oz glass of wine (148 mL), or one 1 oz glass of hard liquor (44 mL). Lifestyle  Take daily care of your teeth and gums.  Stay active. Exercise for at least 30 minutes on 5 or more days each week.  Do not use any products that contain nicotine or tobacco, such as cigarettes, e-cigarettes, and chewing tobacco. If you need help quitting, ask your health care provider.  If you are sexually active, practice safe sex. Use a condom or other form of protection to prevent STIs (sexually transmitted infections).  Talk with your health care provider about taking a low-dose aspirin every day starting at age 45. What's next?  Go to your health care provider once a year for a well check visit.  Ask your health care provider how often you should have your eyes and teeth checked.  Stay up to date on all vaccines. This information is not intended to replace advice given to you by your health  care provider. Make sure you discuss any questions you have with your health care provider. Document Released: 08/12/2015 Document Revised: 07/10/2018 Document Reviewed: 07/10/2018 Elsevier Patient Education  2020 [place], MD [facility]

## 2019-04-28 ENCOUNTER — Encounter: Payer: Self-pay | Admitting: Internal Medicine

## 2019-04-28 ENCOUNTER — Emergency Department (HOSPITAL_COMMUNITY): Payer: 59

## 2019-04-28 ENCOUNTER — Other Ambulatory Visit: Payer: Self-pay

## 2019-04-28 ENCOUNTER — Encounter (HOSPITAL_COMMUNITY): Payer: Self-pay | Admitting: Emergency Medicine

## 2019-04-28 ENCOUNTER — Emergency Department (HOSPITAL_COMMUNITY)
Admission: EM | Admit: 2019-04-28 | Discharge: 2019-04-28 | Disposition: A | Discharge status: Home / Self Care | Payer: 59 | Attending: Emergency Medicine | Admitting: Emergency Medicine

## 2019-04-28 DIAGNOSIS — F17210 Nicotine dependence, cigarettes, uncomplicated: Secondary | ICD-10-CM | POA: Insufficient documentation

## 2019-04-28 DIAGNOSIS — Z7901 Long term (current) use of anticoagulants: Secondary | ICD-10-CM | POA: Diagnosis not present

## 2019-04-28 DIAGNOSIS — I2699 Other pulmonary embolism without acute cor pulmonale: Secondary | ICD-10-CM | POA: Diagnosis not present

## 2019-04-28 DIAGNOSIS — R0789 Other chest pain: Secondary | ICD-10-CM | POA: Diagnosis present

## 2019-04-28 DIAGNOSIS — R42 Dizziness and giddiness: Secondary | ICD-10-CM | POA: Insufficient documentation

## 2019-04-28 LAB — TSH: TSH: 1.968 u[IU]/mL (ref 0.350–4.500)

## 2019-04-28 LAB — BASIC METABOLIC PANEL
Anion gap: 12 (ref 5–15)
BUN: 16 mg/dL (ref 6–20)
CO2: 22 mmol/L (ref 22–32)
Calcium: 9.2 mg/dL (ref 8.9–10.3)
Chloride: 102 mmol/L (ref 98–111)
Creatinine, Ser: 1.13 mg/dL (ref 0.61–1.24)
GFR calc Af Amer: >60 mL/min (ref >60)
GFR calc non Af Amer: >60 mL/min (ref >60)
Glucose, Bld: 124 mg/dL — ABNORMAL HIGH (ref 70–99)
Potassium: 3.9 mmol/L (ref 3.5–5.1)
Sodium: 136 mmol/L (ref 135–145)

## 2019-04-28 LAB — CBC
HCT: 48.3 % (ref 39.0–52.0)
Hemoglobin: 16.8 g/dL (ref 13.0–17.0)
MCH: 30.6 pg (ref 26.0–34.0)
MCHC: 34.8 g/dL (ref 30.0–36.0)
MCV: 88 fL (ref 80.0–100.0)
Platelets: 180 10*3/uL (ref 150–400)
RBC: 5.49 MIL/uL (ref 4.22–5.81)
RDW: 12 % (ref 11.5–15.5)
WBC: 7.7 10*3/uL (ref 4.0–10.5)
nRBC: 0 % (ref 0.0–0.2)

## 2019-04-28 LAB — TROPONIN I (HIGH SENSITIVITY)
Troponin I (High Sensitivity): 3 ng/L (ref <18)
Troponin I (High Sensitivity): 3 ng/L (ref <18)

## 2019-04-28 LAB — T4, FREE: Free T4: 1.06 ng/dL (ref 0.61–1.12)

## 2019-04-28 MED ORDER — SODIUM CHLORIDE 0.9% FLUSH: 3.0000 mL | Freq: Once | INTRAVENOUS | Status: DC

## 2019-04-28 NOTE — ED Provider Notes (Signed)
[facility] EMERGENCY DEPARTMENT Provider Note   CSN: [id number] Arrival date & time: 04/28/19  1503     History   Chief Complaint Chief Complaint  Patient presents with  . Chest Pain    HPI Caleb Arias is a 43 y.o. male.     HPI Patient with recent diagnosis of pulmonary embolism presents with concern of chest pain.  He notes that prior to the episode of pulmonary embolism he smoked cigarettes substantially, had poor diet. Since that diagnosis 2 months ago he has had lifestyle changes, has had substantial reduction in his cigarette smoking. He has been med compliant with medication, but has had episodes of chest pain that occur on a daily basis, typically in the evening, with tightness, flushed sensation and mild dyspnea. He has both been seen and evaluated for these in the emergency department, and called EMS for episodes previously. Today he notes that he after having a similar episode he called his primary care physician and was sent here for evaluation. Currently states that he feels slightly better than he did prior to calling EMS, continues to have some mild generalized discomfort. Since his initial diagnosis he has had no weight loss, intentional, no other constitutional changes. Past Medical History:  Diagnosis Date  . Pulmonary emboli ([facility])   . Tobacco abuse     Patient Active Problem List   Diagnosis Date Noted  . IGT (impaired glucose tolerance) 04/22/2019  . Hyperlipidemia 04/22/2019  . Obesity (BMI 30.0-34.9) 03/11/2019  . Vitamin D deficiency 03/11/2019  . Tobacco abuse     History reviewed. No pertinent surgical history.      Home Medications    Prior to Admission medications   Medication Sig Start Date End Date Taking? Authorizing Provider  buPROPion (WELLBUTRIN XL) 150 MG 24 hr tablet Take 1 tablet (150 mg total) by mouth daily. 03/11/19   Philip Aspen, Limmie Patricia, MD  ELIQUIS 5 MG TABS tablet Take 1 tablet (5 mg total) by  mouth 2 (two) times daily. 03/11/19   Philip Aspen, Limmie Patricia, MD  Vitamin D, Ergocalciferol, (DRISDOL) 1.25 MG (50000 UT) CAPS capsule Take 1 capsule (50,000 Units total) by mouth every 7 (seven) days for 12 doses. 03/11/19 05/28/19  Henderson Cloud, MD    Family History No family history on file.  Social History Social History   Tobacco Use  . Smoking status: Current Every Day Smoker    Packs/day: 1.50    Types: Cigarettes  . Smokeless tobacco: Never Used  Substance Use Topics  . Alcohol use: Yes  . Drug use: Never     Allergies   Patient has no known allergies.   Review of Systems Review of Systems  Constitutional:       Per HPI, otherwise negative  HENT:       Per HPI, otherwise negative  Respiratory:       Per HPI, otherwise negative  Cardiovascular:       Per HPI, otherwise negative  Gastrointestinal: Negative for vomiting.  Endocrine:       Negative aside from HPI  Genitourinary:       Neg aside from HPI   Musculoskeletal:       Per HPI, otherwise negative  Skin: Negative.   Neurological: Negative for syncope.  Hematological:       On Eliquis     Physical Exam Updated Vital Signs BP (!) 138/92   Pulse 82   Temp 98.2 F (36.8 C) (Oral)  Resp 14   Ht 5\' 11"  (1.803 m)   Wt 113 kg   SpO2 97%   BMI 34.75 kg/m   Physical Exam Vitals signs and nursing note reviewed.  Constitutional:      General: He is not in acute distress.    Appearance: He is well-developed.  HENT:     Head: Normocephalic and atraumatic.  Eyes:     Conjunctiva/sclera: Conjunctivae normal.  Cardiovascular:     Rate and Rhythm: Normal rate and regular rhythm.  Pulmonary:     Effort: Pulmonary effort is normal. No respiratory distress.     Breath sounds: No stridor.  Abdominal:     General: There is no distension.  Skin:    General: Skin is warm and dry.  Neurological:     Mental Status: He is alert and oriented to person, place, and time.      ED  Treatments / Results  Labs (all labs ordered are listed, but only abnormal results are displayed) Labs Reviewed  BASIC METABOLIC PANEL - Abnormal; Notable for the following components:      Result Value   Glucose, Bld 124 (*)    All other components within normal limits  CBC  TSH  T4, FREE  TROPONIN I (HIGH SENSITIVITY)  TROPONIN I (HIGH SENSITIVITY)    EKG EKG Interpretation  Date/Time:  Tuesday April 28 2019 15:08:34 EDT Ventricular Rate:  100 PR Interval:  112 QRS Duration: 94 QT Interval:  340 QTC Calculation: 438 R Axis:   73 Text Interpretation:  Normal sinus rhythm Baseline wander Otherwise within normal limits Confirmed by Carmin Muskrat [id number]) on 04/28/2019 6:32:42 PM   Radiology Dg Chest 2 View  Result Date: 04/28/2019 CLINICAL DATA:  Chest pain EXAM: CHEST - 2 VIEW COMPARISON:  04/21/2019 FINDINGS: The heart size and mediastinal contours are within normal limits. Both lungs are clear. The visualized skeletal structures are unremarkable. IMPRESSION: No acute abnormality of the lungs. Electronically Signed   By: Eddie Candle M.D.   On: 04/28/2019 15:32    Procedures Procedures (including critical care time)  Medications Ordered in ED Medications  sodium chloride flush (NS) 0.9 % injection 3 mL (has no administration in time range)     Initial Impression / Assessment and Plan / ED Course  I have reviewed the triage vital signs and the nursing notes.  Pertinent labs & imaging results that were available during my care of the patient were reviewed by me and considered in my medical decision making (see chart for details).        8:50 PM Patient awake, alert, in no distress, no hypoxia, tachypnea, tachycardia. No evidence for pulmonary embolism and is compliant with his medication. 2 normal troponins, nonischemic AGS reassuring for no evidence for ACS. No evidence for pneumonia either. We had a lengthy conversation about possible causes for his  symptoms, including endocrinologic etiology, new medication effects, or smoking cessation efforts. With no evidence for acute new pathology the patient is appropriate for discharge with close outpatient follow-up with his physician, and cardiology which is scheduled for this week.  Final Clinical Impressions(s) / ED Diagnoses   Final diagnoses:  Atypical chest pain     Carmin Muskrat, MD 04/28/19 2051

## 2019-04-28 NOTE — ED Triage Notes (Signed)
Pt c/o L sided CP with lightheadedness onset beginning of august worsening in the last week. Worse in the evenings. Pt had PE in August, compliant with meds.

## 2019-04-28 NOTE — Discharge Instructions (Addendum)
As discussed, your evaluation today has been largely reassuring.  But, it is important that you monitor your condition carefully, and do not hesitate to return to the ED if you develop new, or concerning changes in your condition. ? ?Otherwise, please follow-up with your physician for appropriate ongoing care. ? ?

## 2019-05-01 ENCOUNTER — Ambulatory Visit: Payer: 59 | Admitting: Cardiovascular Disease

## 2019-05-01 ENCOUNTER — Encounter: Payer: Self-pay | Admitting: Cardiovascular Disease

## 2019-05-01 ENCOUNTER — Other Ambulatory Visit: Payer: Self-pay

## 2019-05-01 DIAGNOSIS — R079 Chest pain, unspecified: Secondary | ICD-10-CM

## 2019-05-01 DIAGNOSIS — I2609 Other pulmonary embolism with acute cor pulmonale: Secondary | ICD-10-CM

## 2019-05-01 DIAGNOSIS — I2782 Chronic pulmonary embolism: Secondary | ICD-10-CM

## 2019-05-01 DIAGNOSIS — R0789 Other chest pain: Secondary | ICD-10-CM | POA: Diagnosis not present

## 2019-05-01 DIAGNOSIS — I2699 Other pulmonary embolism without acute cor pulmonale: Secondary | ICD-10-CM | POA: Insufficient documentation

## 2019-05-01 DIAGNOSIS — Z720 Tobacco use: Secondary | ICD-10-CM

## 2019-05-01 MED ORDER — METOPROLOL TARTRATE 100 MG PO TABS: 100.0000 mg | ORAL_TABLET | Freq: Once | ORAL | 0 refills | Status: DC

## 2019-05-01 NOTE — Patient Instructions (Signed)
STATUS: TO BE SCHEDULED   Your cardiac CT will be scheduled at one of the below locations:   [facility] [address] [place], [place] [address] (336) [place] [address] [place], [place] [address] [phone]  If scheduled at [facility], please arrive at the [facility] main entrance of [facility] 30-45 minutes prior to test start time. Proceed to the [facility] Radiology Department (first floor) to check-in and test prep.  If scheduled at [facility], please arrive 15 mins early for check-in and test prep.  Please follow these instructions carefully (unless otherwise directed):  Hold all erectile dysfunction medications at least 3 days (72 hrs) prior to test.  On the Night Before the Test: . Be sure to Drink plenty of water. . Do not consume any caffeinated/decaffeinated beverages or chocolate 12 hours prior to your test. . Do not take any antihistamines 12 hours prior to your test.   On the Day of the Test: . Drink plenty of water. Do not drink any water within one hour of the test. . Do not eat any food 4 hours prior to the test. . You may take your regular medications prior to the test.  . Take metoprolol (Lopressor) 100 MG two hours prior to test.       After the Test: . Drink plenty of water. . After receiving IV contrast, you may experience a mild flushed feeling. This is normal. . On occasion, you may experience a mild rash up to 24 hours after the test. This is not dangerous. If this occurs, you can take Benadryl 25 mg and increase your fluid intake. . If you experience trouble breathing, this can be serious. If it is severe call 911 IMMEDIATELY. If it is mild, please call our office.    Please contact the cardiac imaging nurse navigator should you have any questions/concerns Marchia Bond, RN Navigator Cardiac Imaging [facility] [phone] Office  [phone] Cell    Follow-Up: At [facility], you and your health needs are our priority.  As part of our continuing mission to provide you with exceptional heart care, we have created designated Provider Care Teams.  These Care Teams include your primary Cardiologist (physician) and Advanced Practice Providers (APPs -  Physician Assistants and Nurse Practitioners) who all work together to provide you with the care you need, when you need it. . You may schedule a follow up appointment as needed. You may see Dr. Gwenlyn Found or one of the following Advanced Practice Providers on your designated Care Team:   . Kerin Ransom, PA-C . Daleen Snook Kroeger, PA-C . Sande Rives, PA-C ____________________ . Almyra Deforest, PA-C . Fabian Sharp, PA-C . Jory Sims, DNP . Rosaria Ferries, PA-C

## 2019-05-01 NOTE — Assessment & Plan Note (Signed)
Patient was recently seen in ER for atypical chest pain and evaluated by Dr. Vanita Panda.  His evaluation was unrevealing.  He is getting atypical chest pain on a daily basis now mostly at night when he lies down.  While this could be reflux he does have a 50-pack-year history tobacco abuse.  Interestingly, his recent chest CT did not mention anything about coronary calcification.  I am going to get a coronary CTA to further evaluate

## 2019-05-01 NOTE — Assessment & Plan Note (Addendum)
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  Is placed on Eliquis oral anticoagulation.  He was seen again with chest pain recently and had a CT scan at [facility] 04/21/2019 that did not show pulmonary embolism.  Presumably anything that he had in his first CT had resolved in the intervening 7 weeks on oral anticoagulation.  He should probably remain on his Eliquis for at least 6 months from the date of initiation.

## 2019-05-01 NOTE — Progress Notes (Signed)
05/01/2019 Caleb Arias   Aug 14, 1975  [id number]  Primary Physician Philip Aspen, Limmie Patricia, MD Primary Cardiologist: Runell Gess MD Nicholes Calamity, [place]  HPI:  Caleb Arias is a 43 y.o. 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  His cardiac risk factors are notable for 50 pack years of tobacco abuse currently trying to stop.  There is no family history for heart disease.  Is never had a heart attack or stroke.  He was diagnosed with a pulmonary embolism while on a long-distance truck tried in [place] on 03/04/2019 and was placed on Eliquis oral anticoagulation.  He recently had a chest CT on 04/21/2019 because of chest pain which did not show pulmonary embolism nor did it comment on coronary calcification.  He has been having nocturnal chest pain on a daily basis with occasional radiation to his left shoulder and neck.   Current Meds  Medication Sig  . buPROPion (WELLBUTRIN XL) 150 MG 24 hr tablet Take 1 tablet (150 mg total) by mouth daily. (Patient taking differently: Take 150 mg by mouth at bedtime. )  . ELIQUIS 5 MG TABS tablet Take 1 tablet (5 mg total) by mouth 2 (two) times daily.  . Vitamin D, Ergocalciferol, (DRISDOL) 1.25 MG (50000 UT) CAPS capsule Take 1 capsule (50,000 Units total) by mouth every 7 (seven) days for 12 doses. (Patient taking differently: Take 50,000 Units by mouth every Thursday. )     Allergies  Allergen Reactions  . Asa [Aspirin] Other (See Comments)    Not to take while on Eliquis    Social History   Socioeconomic History  . Marital status: Single    Spouse name: Not on file  . Number of children: Not on file  . Years of education: Not on file  . Highest education level: Not on file  Occupational History  . Not on file  Social Needs  . Financial resource strain: Not on file  . Food insecurity    Worry:  Not on file    Inability: Not on file  . Transportation needs    Medical: Not on file    Non-medical: Not on file  Tobacco Use  . Smoking status: Current Every Day Smoker    Packs/day: 1.50    Types: Cigarettes  . Smokeless tobacco: Never Used  Substance and Sexual Activity  . Alcohol use: Yes  . Drug use: Never  . Sexual activity: Not on file  Lifestyle  . Physical activity    Days per week: Not on file    Minutes per session: Not on file  . Stress: Not on file  Relationships  . Social Musician on phone: Not on file    Gets together: Not on file    Attends religious service: Not on file    Active member of club or organization: Not on file    Attends meetings of clubs or organizations: Not on file    Relationship status: Not on file  . Intimate partner violence    Fear of current or ex partner: Not on file    Emotionally abused: Not on file    Physically abused: Not on file    Forced sexual activity: Not on file  Other Topics Concern  . Not on file  Social History Narrative  . Not on file  Review of Systems: General: negative for chills, fever, night sweats or weight changes.  Cardiovascular: negative for chest pain, dyspnea on exertion, edema, orthopnea, palpitations, paroxysmal nocturnal dyspnea or shortness of breath Dermatological: negative for rash Respiratory: negative for cough or wheezing Urologic: negative for hematuria Abdominal: negative for nausea, vomiting, diarrhea, bright red blood per rectum, melena, or hematemesis Neurologic: negative for visual changes, syncope, or dizziness All other systems reviewed and are otherwise negative except as noted above.    Blood pressure 122/88, pulse 69, temperature 97.7 F (36.5 C), height 5\' 11"  (1.803 m), weight 245 lb 12.8 oz (111.5 kg), SpO2 99 %.  General appearance: alert and no distress Neck: no adenopathy, no carotid bruit, no JVD, supple, symmetrical, trachea midline and thyroid not  enlarged, symmetric, no tenderness/mass/nodules Lungs: clear to auscultation bilaterally Heart: regular rate and rhythm, S1, S2 normal, no murmur, click, rub or gallop Extremities: extremities normal, atraumatic, no cyanosis or edema Pulses: 2+ and symmetric Skin: Skin color, texture, turgor normal. No rashes or lesions Neurologic: Alert and oriented X 3, normal strength and tone. Normal symmetric reflexes. 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  Is placed on Eliquis oral anticoagulation.  He was seen again with chest pain recently and had a CT scan at [facility] 04/21/2019 that did not show pulmonary embolism.  Presumably anything that he had in his first CT had resolved in the intervening 7 weeks on oral anticoagulation.  He should probably remain on his Eliquis for at least 6 months from the date of initiation.  Atypical chest pain Patient was recently seen in ER for atypical chest pain and evaluated by Dr. Vanita Panda.  His evaluation was unrevealing.  He is getting atypical chest pain on a daily basis now mostly at night when he lies down.  While this could be reflux he does have a 50-pack-year history tobacco abuse.  Interestingly, his recent chest CT did not mention anything about coronary calcification.  I am going to get a coronary CTA to further evaluate      Lorretta Harp MD [facility], [facility] 05/01/2019 9:46 AM

## 2019-05-05 ENCOUNTER — Ambulatory Visit: Payer: 59 | Admitting: Internal Medicine

## 2019-05-05 ENCOUNTER — Emergency Department (HOSPITAL_COMMUNITY)
Admission: EM | Admit: 2019-05-05 | Discharge: 2019-05-05 | Disposition: A | Discharge status: Home / Self Care | Payer: 59 | Attending: Emergency Medicine | Admitting: Emergency Medicine

## 2019-05-05 ENCOUNTER — Emergency Department (HOSPITAL_COMMUNITY): Payer: 59

## 2019-05-05 DIAGNOSIS — R0789 Other chest pain: Secondary | ICD-10-CM | POA: Diagnosis present

## 2019-05-05 DIAGNOSIS — F17210 Nicotine dependence, cigarettes, uncomplicated: Secondary | ICD-10-CM | POA: Insufficient documentation

## 2019-05-05 DIAGNOSIS — Z79899 Other long term (current) drug therapy: Secondary | ICD-10-CM | POA: Insufficient documentation

## 2019-05-05 DIAGNOSIS — R0600 Dyspnea, unspecified: Secondary | ICD-10-CM | POA: Insufficient documentation

## 2019-05-05 DIAGNOSIS — M79672 Pain in left foot: Secondary | ICD-10-CM | POA: Diagnosis not present

## 2019-05-05 DIAGNOSIS — Z7901 Long term (current) use of anticoagulants: Secondary | ICD-10-CM | POA: Insufficient documentation

## 2019-05-05 DIAGNOSIS — M25572 Pain in left ankle and joints of left foot: Secondary | ICD-10-CM | POA: Diagnosis not present

## 2019-05-05 DIAGNOSIS — R079 Chest pain, unspecified: Secondary | ICD-10-CM

## 2019-05-05 LAB — CBC
HCT: 48.1 % (ref 39.0–52.0)
Hemoglobin: 16.2 g/dL (ref 13.0–17.0)
MCH: 29.7 pg (ref 26.0–34.0)
MCHC: 33.7 g/dL (ref 30.0–36.0)
MCV: 88.3 fL (ref 80.0–100.0)
Platelets: 171 10*3/uL (ref 150–400)
RBC: 5.45 MIL/uL (ref 4.22–5.81)
RDW: 12.2 % (ref 11.5–15.5)
WBC: 6.9 10*3/uL (ref 4.0–10.5)
nRBC: 0 % (ref 0.0–0.2)

## 2019-05-05 LAB — BASIC METABOLIC PANEL
Anion gap: 12 (ref 5–15)
BUN: 14 mg/dL (ref 6–20)
CO2: 22 mmol/L (ref 22–32)
Calcium: 9.1 mg/dL (ref 8.9–10.3)
Chloride: 105 mmol/L (ref 98–111)
Creatinine, Ser: 0.96 mg/dL (ref 0.61–1.24)
GFR calc Af Amer: >60 mL/min (ref >60)
GFR calc non Af Amer: >60 mL/min (ref >60)
Glucose, Bld: 80 mg/dL (ref 70–99)
Potassium: 3.7 mmol/L (ref 3.5–5.1)
Sodium: 139 mmol/L (ref 135–145)

## 2019-05-05 LAB — TROPONIN I (HIGH SENSITIVITY)
Troponin I (High Sensitivity): <2 ng/L (ref <18)
Troponin I (High Sensitivity): 3 ng/L (ref <18)

## 2019-05-05 MED ORDER — PANTOPRAZOLE SODIUM 40 MG PO TBEC: 40.0000 mg | DELAYED_RELEASE_TABLET | Freq: Every day | ORAL | 0 refills | Status: DC

## 2019-05-05 MED ORDER — SODIUM CHLORIDE 0.9% FLUSH: 3.0000 mL | Freq: Once | INTRAVENOUS | Status: DC

## 2019-05-05 NOTE — ED Triage Notes (Signed)
Pt arrives to ED with c/o of cp and lightheaded that has been coming and going for the last 2 weeks. Pt reports having a PE in august and is currently on a blood thinner. Pt states he has also been having pain in the top of his left foot.

## 2019-05-05 NOTE — ED Provider Notes (Signed)
[facility] EMERGENCY DEPARTMENT Provider Note   CSN: [id number] Arrival date & time: 05/05/19  1207     History   Chief Complaint Chief Complaint  Patient presents with  . Chest Pain    HPI Caleb Arias is a 43 y.o. male.     HPI  43 year old male presents with chest pain. Has been occurring daily for weeks. At first was mostly at night, now is random. Lasts 30+ min. Mostly recently occurred about 1 hour ago. Gone now. Feels the same as previous times. Feels warm in chest, some feeling like his HR increases, chest tightness and dyspnea. Sometimes pain down the arm. Was diagnosed with PE in August, placed on eliquis. Has been compliant. Seen here since with negative CT angio. Has seen cardiology, due to for CT coronaries. Has decreased his smoking. No abdominal pain or worse pain with eating. Also noticed some vague left ankle/foot pain. No swelling. Better with ambulation. No calf pain/swelling. No injury.   Past Medical History:  Diagnosis Date  . Pulmonary emboli ([place])   . Tobacco abuse     Patient Active Problem List   Diagnosis Date Noted  . Pulmonary embolism ([place]) 05/01/2019  . Atypical chest pain 05/01/2019  . IGT (impaired glucose tolerance) 04/22/2019  . Hyperlipidemia 04/22/2019  . Obesity (BMI 30.0-34.9) 03/11/2019  . Vitamin D deficiency 03/11/2019  . Tobacco abuse     No past surgical history on file.      Home Medications    Prior to Admission medications   Medication Sig Start Date End Date Taking? Authorizing Provider  buPROPion (WELLBUTRIN XL) 150 MG 24 hr tablet Take 1 tablet (150 mg total) by mouth daily. Patient taking differently: Take 150 mg by mouth at bedtime.  03/11/19  Yes Isaac Bliss, Rayford Halsted, MD  ELIQUIS 5 MG TABS tablet Take 1 tablet (5 mg total) by mouth 2 (two) times daily. 03/11/19  Yes Isaac Bliss, Rayford Halsted, MD  Vitamin D, Ergocalciferol, (DRISDOL) 1.25 MG (50000 UT) CAPS capsule Take 1 capsule (50,000  Units total) by mouth every 7 (seven) days for 12 doses. Patient taking differently: Take 50,000 Units by mouth every Thursday.  03/11/19 05/28/19 Yes Erline Hau, MD  metoprolol tartrate (LOPRESSOR) 100 MG tablet Take 1 tablet (100 mg total) by mouth once for 1 dose. TAKE 1 TABLET TWO HOURS PRIOR TO YOUR CORONARY CTA 05/01/19 05/01/19  Lorretta Harp, MD  pantoprazole (PROTONIX) 40 MG tablet Take 1 tablet (40 mg total) by mouth daily. 05/05/19   Sherwood Gambler, MD    Family History No family history on file.  Social History Social History   Tobacco Use  . Smoking status: Current Every Day Smoker    Packs/day: 1.50    Types: Cigarettes  . Smokeless tobacco: Never Used  Substance Use Topics  . Alcohol use: Yes  . Drug use: Never     Allergies   Asa [aspirin]   Review of Systems Review of Systems  Respiratory: Positive for shortness of breath.   Cardiovascular: Positive for chest pain.  Gastrointestinal: Negative for abdominal pain and vomiting.  Musculoskeletal: Positive for arthralgias. Negative for joint swelling.  All other systems reviewed and are negative.    Physical Exam Updated Vital Signs BP 124/85   Pulse 67   Temp 97.8 F (36.6 C)   Resp 18   SpO2 96%   Physical Exam Vitals signs and nursing note reviewed.  Constitutional:  Appearance: He is well-developed.  HENT:     Head: Normocephalic and atraumatic.     Right Ear: External ear normal.     Left Ear: External ear normal.     Nose: Nose normal.  Eyes:     General:        Right eye: No discharge.        Left eye: No discharge.  Neck:     Musculoskeletal: Neck supple.  Cardiovascular:     Rate and Rhythm: Normal rate and regular rhythm.     Heart sounds: Normal heart sounds.  Pulmonary:     Effort: Pulmonary effort is normal.     Breath sounds: Normal breath sounds.  Abdominal:     Palpations: Abdomen is soft.     Tenderness: There is no abdominal tenderness.   Musculoskeletal:     Comments: No left foot/ankle/or calf tenderness/swelling. Normal gross sensation.  Skin:    General: Skin is warm and dry.  Neurological:     Mental Status: He is alert.  Psychiatric:        Mood and Affect: Mood is not anxious.      ED Treatments / Results  Labs (all labs ordered are listed, but only abnormal results are displayed) Labs Reviewed  BASIC METABOLIC PANEL  CBC  TROPONIN I (HIGH SENSITIVITY)  TROPONIN I (HIGH SENSITIVITY)    EKG EKG Interpretation  Date/Time:  Tuesday May 05 2019 12:16:12 EDT Ventricular Rate:  74 PR Interval:  120 QRS Duration: 106 QT Interval:  376 QTC Calculation: 417 R Axis:   77 Text Interpretation:  Normal sinus rhythm Incomplete right bundle branch block Borderline ECG no significant change since Sept 2020 Confirmed by Pricilla LovelessGoldston, Ailis Rigaud [phone]([id number]) on 05/05/2019 12:51:53 PM   Radiology Dg Chest 2 View  Result Date: 05/05/2019 CLINICAL DATA:  Left side chest pain and lightheadedness today. Pt states he has been experiencing these symptoms about once a day, every day, over the past several weeks. Hx of PE in August 2020. Hx of asthma. Smoker.sob EXAM: CHEST - 2 VIEW COMPARISON:  04/28/2019 FINDINGS: Normal mediastinum and cardiac silhouette. Normal pulmonary vasculature. No evidence of effusion, infiltrate, or pneumothorax. No acute bony abnormality. IMPRESSION: No acute cardiopulmonary process. Electronically Signed   By: Genevive BiStewart  Edmunds M.D.   On: 05/05/2019 12:48   Dg Ankle Complete Left  Result Date: 05/05/2019 CLINICAL DATA:  Patient reports anterior left ankle, and dorsal left foot pains over past few days. States pain gets better after movement and bearing weight for a while. Denies any onset injury. Denies any prior injury or surgery to left foot or left ankle. EXAM: LEFT ANKLE COMPLETE - 3+ VIEW COMPARISON:  None. FINDINGS: No fracture or bone lesion. The ankle mortise is normally spaced and aligned. No  arthropathic changes. Small plantar and dorsal calcaneal spurs. Normal soft tissues. IMPRESSION: 1. No fracture, bone lesion or ankle joint abnormality. 2. Small calcaneal spurs. Electronically Signed   By: Amie Portlandavid  Ormond M.D.   On: 05/05/2019 13:54   Dg Foot Complete Left  Result Date: 05/05/2019 CLINICAL DATA:  Patient reports anterior left ankle, and dorsal left foot pains over past few days. States pain gets better after movement and bearing weight for a while. Denies any onset injury. Denies any prior injury or surgery to left foot or left ankle. EXAM: LEFT FOOT - COMPLETE 3+ VIEW COMPARISON:  None. FINDINGS: No fracture.  No bone lesion. Joints normally spaced and aligned. Minor marginal spurring from the medial  aspect of the first metatarsophalangeal joint. No other degenerative change. Small plantar and dorsal calcaneal spurs. Soft tissues are unremarkable. IMPRESSION: 1. No fracture or acute finding. 2. Minor first metatarsophalangeal joint osteoarthritis. 3. Small calcaneal spurs. Electronically Signed   By: Amie Portland M.D.   On: 05/05/2019 13:53    Procedures Procedures (including critical care time)  Medications Ordered in ED Medications  sodium chloride flush (NS) 0.9 % injection 3 mL (has no administration in time range)     Initial Impression / Assessment and Plan / ED Course  I have reviewed the triage vital signs and the nursing notes.  Pertinent labs & imaging results that were available during my care of the patient were reviewed by me and considered in my medical decision making (see chart for details).        Patient's chest pain is pretty atypical.  It has been occurring daily.  He is had multiple negative troponins.  Plan for second troponin given the first 1 was obtained only an hour or so after his chest pain onset.  My suspicion for PE is pretty low given the negative CT angiography a couple weeks ago.  He is not hypoxic or tachycardic.  He has already seen  cardiology and should continue this work-up.  Possibly this is GI and so we will start him on Protonix but otherwise he should be discharged home with return precautions if his second troponin is negative. Foot Xray is benign. No clear cause of mild intermittent pain. Highly doubt DVT.   Final Clinical Impressions(s) / ED Diagnoses   Final diagnoses:  Nonspecific chest pain    ED Discharge Orders         Ordered    pantoprazole (PROTONIX) 40 MG tablet  Daily     05/05/19 1549           Pricilla Loveless, MD 05/05/19 1557

## 2019-05-05 NOTE — Discharge Instructions (Signed)
If you develop recurrent, continued, or worsening chest pain, shortness of breath, fever, vomiting, abdominal or back pain, or any other new/concerning symptoms then return to the ER for evaluation.  

## 2019-05-05 NOTE — ED Notes (Signed)
Patient verbalizes understanding of discharge instructions. Opportunity for questioning and answers were provided. Armband removed by staff, pt discharged from ED ambulatory.   

## 2019-05-12 ENCOUNTER — Other Ambulatory Visit: Payer: Self-pay

## 2019-05-12 ENCOUNTER — Encounter: Payer: Self-pay | Admitting: Internal Medicine

## 2019-05-12 ENCOUNTER — Ambulatory Visit: Payer: 59 | Admitting: Internal Medicine

## 2019-05-12 VITALS — BP 110/70 | HR 85 | Temp 98.3°F | Wt 245.2 lb

## 2019-05-12 DIAGNOSIS — Z720 Tobacco use: Secondary | ICD-10-CM

## 2019-05-12 DIAGNOSIS — K219 Gastro-esophageal reflux disease without esophagitis: Secondary | ICD-10-CM | POA: Diagnosis not present

## 2019-05-12 DIAGNOSIS — I2699 Other pulmonary embolism without acute cor pulmonale: Secondary | ICD-10-CM | POA: Diagnosis not present

## 2019-05-12 MED ORDER — PANTOPRAZOLE SODIUM 40 MG PO TBEC: 40.0000 mg | DELAYED_RELEASE_TABLET | Freq: Every day | ORAL | 0 refills | Status: DC

## 2019-05-12 MED ORDER — ELIQUIS 5 MG PO TABS: 5.0000 mg | ORAL_TABLET | Freq: Two times a day (BID) | ORAL | 2 refills | Status: DC

## 2019-05-12 MED ORDER — PANTOPRAZOLE SODIUM 40 MG PO TBEC: 40.0000 mg | DELAYED_RELEASE_TABLET | Freq: Every day | ORAL | 1 refills | Status: DC

## 2019-05-12 MED ORDER — BUPROPION HCL ER (XL) 150 MG PO TB24: 150.0000 mg | ORAL_TABLET | Freq: Every day | ORAL | 2 refills | Status: DC

## 2019-05-12 NOTE — Progress Notes (Signed)
Established Patient Office Visit     CC/Reason for Visit: ED follow-up  HPI: Caleb Arias is a 43 y.o. male who is coming in today for the above mentioned reasons.  He again went to the ED with chest pain.  He saw cardiology on 10/2 and it was recommended that he get a coronary CT, however he has not heard back from scheduling yet.  On 10/6 he again had another one of these episodes of atypical chest pain.  Since his initial PE diagnosis in August he has had about 3-4 ED visits.  At one point he had a repeat CT Angio of the chest that did not show repeat PE, he has had several negative troponins and EKG without acute ischemic changes.  Chest pain is again persistent in the left anterior chest wall.  He states it is somewhat constant.  He has been trying to modify his risk factors, he has significantly cut down his smoking and has lost about 12 pounds since mid August..  He was started on Protonix on 10/6 has maybe noticed some minimal improvement since then.   Past Medical/Surgical History: Past Medical History:  Diagnosis Date  . Pulmonary emboli ([facility])   . Tobacco abuse     No past surgical history on file.  Social History:  reports that he has been smoking cigarettes. He has been smoking about 1.50 packs per day. He has never used smokeless tobacco. He reports current alcohol use. He reports that he does not use drugs.  Allergies: Allergies  Allergen Reactions  . Asa [Aspirin] Other (See Comments)    Not to take while on Eliquis    Family History:  No history of cancer or stroke that he is aware of  Current Outpatient Medications:  .  buPROPion (WELLBUTRIN XL) 150 MG 24 hr tablet, Take 1 tablet (150 mg total) by mouth daily., Disp: 30 tablet, Rfl: 2 .  ELIQUIS 5 MG TABS tablet, Take 1 tablet (5 mg total) by mouth 2 (two) times daily., Disp: 60 tablet, Rfl: 2 .  pantoprazole (PROTONIX) 40 MG tablet, Take 1 tablet (40 mg total) by mouth daily., Disp: 30 tablet, Rfl: 0 .   Vitamin D, Ergocalciferol, (DRISDOL) 1.25 MG (50000 UT) CAPS capsule, Take 1 capsule (50,000 Units total) by mouth every 7 (seven) days for 12 doses. (Patient taking differently: Take 50,000 Units by mouth every Thursday. ), Disp: 12 capsule, Rfl: 0 .  metoprolol tartrate (LOPRESSOR) 100 MG tablet, Take 1 tablet (100 mg total) by mouth once for 1 dose. TAKE 1 TABLET TWO HOURS PRIOR TO YOUR CORONARY CTA, Disp: 1 tablet, Rfl: 0  Review of Systems:  Constitutional: Denies fever, chills, diaphoresis, appetite change and fatigue.  HEENT: Denies photophobia, eye pain, redness, hearing loss, ear pain, congestion, sore throat, rhinorrhea, sneezing, mouth sores, trouble swallowing, neck pain, neck stiffness and tinnitus.   Respiratory: Denies SOB, DOE, cough, chest tightness,  and wheezing.   Cardiovascular: Denies palpitations and leg swelling.  Gastrointestinal: Denies nausea, vomiting, abdominal pain, diarrhea, constipation, blood in stool and abdominal distention.  Genitourinary: Denies dysuria, urgency, frequency, hematuria, flank pain and difficulty urinating.  Endocrine: Denies: hot or cold intolerance, sweats, changes in hair or nails, polyuria, polydipsia. Musculoskeletal: Denies myalgias, back pain, joint swelling, arthralgias and gait problem.  Skin: Denies pallor, rash and wound.  Neurological: Denies dizziness, seizures, syncope, weakness, light-headedness, numbness and headaches.  Hematological: Denies adenopathy. Easy bruising, personal or family bleeding history  Psychiatric/Behavioral: Denies suicidal  ideation, mood changes, confusion, nervousness, sleep disturbance and agitation    Physical Exam: Vitals:   05/12/19 1355  BP: 110/70  Pulse: 85  Temp: 98.3 F (36.8 C)  TempSrc: Temporal  SpO2: 96%  Weight: 245 lb 3.2 oz (111.2 kg)    Body mass index is 34.2 kg/m.   Constitutional: NAD, calm, comfortable Eyes: PERRL, lids and conjunctivae normal ENMT: Mucous membranes are  moist.  Respiratory: clear to auscultation bilaterally, no wheezing, no crackles. Normal respiratory effort. No accessory muscle use.  Cardiovascular: Regular rate and rhythm, no murmurs / rubs / gallops. No extremity edema. 2+ pedal pulses. No carotid bruits.  Psychiatric: Normal judgment and insight. Alert and oriented x 3. Normal mood.    Impression and Plan:  Gastroesophageal reflux disease without esophagitis -Continue Protonix, this may or may not be the cause of his atypical chest pain.  Tobacco abuse  -Has been doing well on Wellbutrin, will refill.  Pulmonary embolism and infarction ([place])  - Plan: ELIQUIS 5 MG TABS tablet   Patient Instructions  -Nice seeing you today!!  -Call Dr. Kennon Holter office and ask about scheduling for your coronary CT.       Lelon Frohlich, MD [facility]

## 2019-05-12 NOTE — Addendum Note (Signed)
Addended by: Lelon Frohlich Y on: 05/12/2019 04:09 PM   Modules accepted: Orders

## 2019-05-12 NOTE — Patient Instructions (Signed)
-  Nice seeing you today!!  -Call Dr. Kennon Holter office and ask about scheduling for your coronary CT.

## 2019-05-21 ENCOUNTER — Telehealth (HOSPITAL_COMMUNITY): Payer: Self-pay | Admitting: Emergency Medicine

## 2019-05-21 NOTE — Telephone Encounter (Signed)
Pt returning phone call regarding upcoming cardiac imaging study; pt verbalizes understanding of appt date/time, parking situation and where to check in, pre-test NPO status and medications ordered, and verified current allergies; name and call back number provided for further questions should they arise Danaly Bari RN Navigator Cardiac Imaging [facility] [phone] office [phone] cell   

## 2019-05-21 NOTE — Telephone Encounter (Signed)
Left message on voicemail with name and callback number Aanyah Loa RN Navigator Cardiac Imaging [facility] [phone] Office [phone] Cell  

## 2019-05-22 ENCOUNTER — Ambulatory Visit (HOSPITAL_COMMUNITY)
Admission: RE | Admit: 2019-05-22 | Discharge: 2019-05-22 | Disposition: A | Discharge status: Home / Self Care | Payer: 59 | Source: Ambulatory Visit | Attending: Cardiovascular Disease | Admitting: Cardiovascular Disease

## 2019-05-22 ENCOUNTER — Other Ambulatory Visit: Payer: Self-pay

## 2019-05-22 DIAGNOSIS — R079 Chest pain, unspecified: Secondary | ICD-10-CM | POA: Insufficient documentation

## 2019-05-22 MED ORDER — NITROGLYCERIN 0.4 MG SL SUBL
SUBLINGUAL_TABLET | SUBLINGUAL | Status: AC
  Filled 2019-05-22: qty 2

## 2019-05-22 MED ORDER — NITROGLYCERIN 0.4 MG SL SUBL
0.8000 mg | SUBLINGUAL_TABLET | Freq: Once | SUBLINGUAL | Status: AC
  Administered 2019-05-22: 0.8 mg via SUBLINGUAL

## 2019-05-22 MED ORDER — IOHEXOL 350 MG/ML SOLN
80.0000 mL | Freq: Once | INTRAVENOUS | Status: AC | PRN
  Administered 2019-05-22: 80 mL via INTRAVENOUS

## 2019-05-22 NOTE — Progress Notes (Signed)
Ct complete. Patient denies any complaints. Offered snack and drink. 

## 2019-05-26 ENCOUNTER — Other Ambulatory Visit: Payer: Self-pay | Admitting: Internal Medicine

## 2019-05-26 DIAGNOSIS — E559 Vitamin D deficiency, unspecified: Secondary | ICD-10-CM

## 2019-05-27 ENCOUNTER — Telehealth: Payer: Self-pay | Admitting: Cardiovascular Disease

## 2019-05-27 NOTE — Telephone Encounter (Signed)
Gave pt results over the phone. Verbalized understanding.

## 2019-05-27 NOTE — Telephone Encounter (Signed)
New message:     Patient calling concering his results for CT. Please call patient.

## 2019-05-28 ENCOUNTER — Other Ambulatory Visit: Payer: Self-pay

## 2019-05-28 ENCOUNTER — Encounter: Payer: Self-pay | Admitting: Internal Medicine

## 2019-05-28 ENCOUNTER — Ambulatory Visit: Payer: 59 | Admitting: Internal Medicine

## 2019-05-28 VITALS — BP 102/68 | HR 84 | Temp 98.1°F | Wt 243.3 lb

## 2019-05-28 DIAGNOSIS — Z720 Tobacco use: Secondary | ICD-10-CM | POA: Diagnosis not present

## 2019-05-28 DIAGNOSIS — R0789 Other chest pain: Secondary | ICD-10-CM | POA: Diagnosis not present

## 2019-05-28 DIAGNOSIS — R002 Palpitations: Secondary | ICD-10-CM

## 2019-05-28 DIAGNOSIS — E785 Hyperlipidemia, unspecified: Secondary | ICD-10-CM

## 2019-05-28 DIAGNOSIS — I2782 Chronic pulmonary embolism: Secondary | ICD-10-CM

## 2019-05-28 DIAGNOSIS — R7302 Impaired glucose tolerance (oral): Secondary | ICD-10-CM

## 2019-05-28 DIAGNOSIS — E669 Obesity, unspecified: Secondary | ICD-10-CM

## 2019-05-28 DIAGNOSIS — E559 Vitamin D deficiency, unspecified: Secondary | ICD-10-CM

## 2019-05-28 DIAGNOSIS — I2609 Other pulmonary embolism with acute cor pulmonale: Secondary | ICD-10-CM

## 2019-05-28 NOTE — Patient Instructions (Signed)
-  Nice seeing you today!!  -Will set you up with an event monitor.

## 2019-05-28 NOTE — Progress Notes (Signed)
Established Patient Office Visit     CC/Reason for Visit: Follow-up chest discomfort in the results of coronary CT  HPI: Caleb Arias is a 43 y.o. male who is coming in today for the above mentioned reasons.  Most recently in the work-up of his atypical chest pain he had a coronary CT scan that had a calcium score of 0.  He states he has not yet heard back from cardiology and that is causing a great deal of anxiety.  He is now reassured with the results of this test.  He has had 2 more episodes this week.  He describes them as a flush that comes over his body after which he becomes lightheaded sweaty sometimes dizzy and with blurry vision, more times than not will have palpitations and he will have chest discomfort in various locations.  He states that if this is "not a heart attack, what is causing my pain".  We started Protonix last visit but he has not noticed a significant difference with that.   Past Medical/Surgical History: Past Medical History:  Diagnosis Date  . Pulmonary emboli (HCC)   . Tobacco abuse     No past surgical history on file.  Social History:  reports that he has been smoking cigarettes. He has been smoking about 1.50 packs per day. He has never used smokeless tobacco. He reports current alcohol use. He reports that he does not use drugs.  Allergies: Allergies  Allergen Reactions  . Asa [Aspirin] Other (See Comments)    Not to take while on Eliquis    Family History:  No history of cancer or stroke  Current Outpatient Medications:  .  buPROPion (WELLBUTRIN XL) 150 MG 24 hr tablet, Take 1 tablet (150 mg total) by mouth daily., Disp: 30 tablet, Rfl: 2 .  ELIQUIS 5 MG TABS tablet, Take 1 tablet (5 mg total) by mouth 2 (two) times daily., Disp: 60 tablet, Rfl: 2 .  pantoprazole (PROTONIX) 40 MG tablet, Take 1 tablet (40 mg total) by mouth daily., Disp: 90 tablet, Rfl: 1 .  Vitamin D, Ergocalciferol, (DRISDOL) 1.25 MG (50000 UT) CAPS capsule, Take 1  capsule (50,000 Units total) by mouth every 7 (seven) days for 12 doses. (Patient taking differently: Take 50,000 Units by mouth every Thursday. ), Disp: 12 capsule, Rfl: 0  Review of Systems:  Constitutional: Denies fever, chills, diaphoresis, appetite change and fatigue.  HEENT: Denies photophobia, eye pain, redness, hearing loss, ear pain, congestion, sore throat, rhinorrhea, sneezing, mouth sores, trouble swallowing, neck pain, neck stiffness and tinnitus.   Respiratory: Denies SOB, DOE, cough, chest tightness,  and wheezing.   Cardiovascular: Denies chest pain, palpitations and leg swelling.  Gastrointestinal: Denies nausea, vomiting, abdominal pain, diarrhea, constipation, blood in stool and abdominal distention.  Genitourinary: Denies dysuria, urgency, frequency, hematuria, flank pain and difficulty urinating.  Endocrine: Denies: hot or cold intolerance, sweats, changes in hair or nails, polyuria, polydipsia. Musculoskeletal: Denies myalgias, back pain, joint swelling, arthralgias and gait problem.  Skin: Denies pallor, rash and wound.  Neurological: Denies dizziness, seizures, syncope, weakness, light-headedness, numbness and headaches.  Hematological: Denies adenopathy. Easy bruising, personal or family bleeding history  Psychiatric/Behavioral: Denies suicidal ideation, mood changes, confusion, nervousness, sleep disturbance and agitation    Physical Exam: Vitals:   05/28/19 1302  BP: 102/68  Pulse: 84  Temp: 98.1 F (36.7 C)  TempSrc: Temporal  SpO2: 95%  Weight: 243 lb 4.8 oz (110.4 kg)    Body mass index is  33.93 kg/m.   Constitutional: NAD, calm, comfortable Eyes: PERRL, lids and conjunctivae normal ENMT: Mucous membranes are moist. Respiratory: clear to auscultation bilaterally, no wheezing, no crackles. Normal respiratory effort. No accessory muscle use.  Cardiovascular: Regular rate and rhythm, no murmurs / rubs / gallops. No extremity edema. 2+ pedal pulses.    Abdomen: no tenderness, no masses palpated. No hepatosplenomegaly. Bowel sounds positive.  Musculoskeletal: no clubbing / cyanosis. No joint deformity upper and lower extremities. Good ROM, no contractures. Normal muscle tone.  Skin: no rashes, lesions, ulcers. No induration Neurologic: Grossly intact and nonfocal Psychiatric: Normal judgment and insight. Alert and oriented x 3. Normal mood.    Impression and Plan:  Atypical chest pain -Given his symptoms I think we would be remiss if we did not rule out a cardiac arrhythmia.  Will arrange for him to have a 30-day event monitor. -If event monitor is without occurrences, need to consider possibility of anxiety and panic attacks. -He continues on a PPI but has not noticed much difference with that. -Continues Eliquis for his PE which she will be on for 12 months.  Tobacco abuse -Has cut down to 5 to 10 cigarettes a week, Wellbutrin helps.  IGT (impaired glucose tolerance) -A1c of 6.0 in August.  Hyperlipidemia, unspecified hyperlipidemia type -LDL of 96.  Obesity (BMI 30.0-34.9) -He has lost a significant amount of weight recently.  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    Patient Instructions  -Nice seeing you today!!  -Will set you up with an event monitor.     Lelon Frohlich, MD [facility] at [facility]

## 2019-06-01 ENCOUNTER — Encounter: Payer: Self-pay | Admitting: Internal Medicine

## 2019-06-03 ENCOUNTER — Telehealth: Payer: Self-pay | Admitting: *Deleted

## 2019-06-03 NOTE — Telephone Encounter (Signed)
Spoke with patient and he will need to have his FLMA extended due to wearing the event monitor.  Patient will call his HR to get the paperwork faxed to the office for Dr Jerilee Hoh to fill out.

## 2019-06-03 NOTE — Telephone Encounter (Signed)
Copied from [place] [id number]. Topic: Quick Communication - See Telephone Encounter >> Jun 03, 2019  2:47 PM Loma Boston wrote: CRM for notification. See Telephone encounter for: 06/03/19. Pt is calling to ask Dr Lemmie Evens or nurse Beckem Tomberlin to give him a call asap re getting on FMLA call him at [phone], she had mention to have him touch base on another option if needed

## 2019-06-05 ENCOUNTER — Encounter: Payer: Self-pay | Admitting: Internal Medicine

## 2019-06-05 ENCOUNTER — Telehealth: Payer: Self-pay | Admitting: Internal Medicine

## 2019-06-10 ENCOUNTER — Encounter: Payer: Self-pay | Admitting: Internal Medicine

## 2019-06-10 ENCOUNTER — Telehealth: Payer: Self-pay | Admitting: *Deleted

## 2019-06-10 NOTE — Telephone Encounter (Signed)
[employer] to ship a 30 day cardiac event monitor to the patients home.  Instructions will be included in the monitor kit. 

## 2019-06-17 ENCOUNTER — Ambulatory Visit (INDEPENDENT_AMBULATORY_CARE_PROVIDER_SITE_OTHER): Payer: Self-pay

## 2019-06-17 DIAGNOSIS — R002 Palpitations: Secondary | ICD-10-CM

## 2019-06-22 ENCOUNTER — Other Ambulatory Visit (INDEPENDENT_AMBULATORY_CARE_PROVIDER_SITE_OTHER): Payer: 59

## 2019-06-22 ENCOUNTER — Other Ambulatory Visit: Payer: Self-pay

## 2019-06-22 DIAGNOSIS — E559 Vitamin D deficiency, unspecified: Secondary | ICD-10-CM

## 2019-06-23 ENCOUNTER — Other Ambulatory Visit: Payer: Self-pay | Admitting: Internal Medicine

## 2019-06-23 DIAGNOSIS — E559 Vitamin D deficiency, unspecified: Secondary | ICD-10-CM

## 2019-06-23 LAB — VITAMIN D 25 HYDROXY (VIT D DEFICIENCY, FRACTURES): VITD: 31.39 ng/mL (ref 30.00–100.00)

## 2019-06-24 ENCOUNTER — Other Ambulatory Visit: Payer: Self-pay | Admitting: Internal Medicine

## 2019-06-24 DIAGNOSIS — E559 Vitamin D deficiency, unspecified: Secondary | ICD-10-CM

## 2019-06-24 MED ORDER — VITAMIN D (ERGOCALCIFEROL) 1.25 MG (50000 UNIT) PO CAPS: 50000.0000 [IU] | ORAL_CAPSULE | ORAL | 0 refills | Status: AC

## 2019-06-26 ENCOUNTER — Other Ambulatory Visit: Payer: Self-pay | Admitting: Internal Medicine

## 2019-06-26 DIAGNOSIS — Z720 Tobacco use: Secondary | ICD-10-CM

## 2019-06-28 ENCOUNTER — Emergency Department (HOSPITAL_COMMUNITY)
Admission: EM | Admit: 2019-06-28 | Discharge: 2019-06-29 | Disposition: A | Discharge status: Home / Self Care | Payer: 59 | Attending: Emergency Medicine | Admitting: Emergency Medicine

## 2019-06-28 ENCOUNTER — Emergency Department (HOSPITAL_COMMUNITY): Payer: 59

## 2019-06-28 ENCOUNTER — Encounter (HOSPITAL_COMMUNITY): Payer: Self-pay | Admitting: Emergency Medicine

## 2019-06-28 ENCOUNTER — Other Ambulatory Visit: Payer: Self-pay

## 2019-06-28 DIAGNOSIS — F17210 Nicotine dependence, cigarettes, uncomplicated: Secondary | ICD-10-CM | POA: Insufficient documentation

## 2019-06-28 DIAGNOSIS — Z79899 Other long term (current) drug therapy: Secondary | ICD-10-CM | POA: Diagnosis not present

## 2019-06-28 DIAGNOSIS — R002 Palpitations: Secondary | ICD-10-CM | POA: Diagnosis not present

## 2019-06-28 DIAGNOSIS — R42 Dizziness and giddiness: Secondary | ICD-10-CM | POA: Insufficient documentation

## 2019-06-28 DIAGNOSIS — H538 Other visual disturbances: Secondary | ICD-10-CM | POA: Insufficient documentation

## 2019-06-28 DIAGNOSIS — R0789 Other chest pain: Secondary | ICD-10-CM | POA: Diagnosis not present

## 2019-06-28 LAB — URINALYSIS, ROUTINE W REFLEX MICROSCOPIC
Bilirubin Urine: NEGATIVE
Glucose, UA: NEGATIVE mg/dL
Hgb urine dipstick: NEGATIVE
Ketones, ur: NEGATIVE mg/dL
Leukocytes,Ua: NEGATIVE
Nitrite: NEGATIVE
Protein, ur: NEGATIVE mg/dL
Specific Gravity, Urine: 1.027 (ref 1.005–1.030)
pH: 5 (ref 5.0–8.0)

## 2019-06-28 LAB — CBC WITH DIFFERENTIAL/PLATELET
Abs Immature Granulocytes: 0.01 10*3/uL (ref 0.00–0.07)
Basophils Absolute: 0.1 10*3/uL (ref 0.0–0.1)
Basophils Relative: 1 %
Eosinophils Absolute: 0.2 10*3/uL (ref 0.0–0.5)
Eosinophils Relative: 3 %
HCT: 47.2 % (ref 39.0–52.0)
Hemoglobin: 15.8 g/dL (ref 13.0–17.0)
Immature Granulocytes: 0 %
Lymphocytes Relative: 28 %
Lymphs Abs: 2.1 10*3/uL (ref 0.7–4.0)
MCH: 29.9 pg (ref 26.0–34.0)
MCHC: 33.5 g/dL (ref 30.0–36.0)
MCV: 89.4 fL (ref 80.0–100.0)
Monocytes Absolute: 0.4 10*3/uL (ref 0.1–1.0)
Monocytes Relative: 5 %
Neutro Abs: 4.7 10*3/uL (ref 1.7–7.7)
Neutrophils Relative %: 63 %
Platelets: 175 10*3/uL (ref 150–400)
RBC: 5.28 MIL/uL (ref 4.22–5.81)
RDW: 12.1 % (ref 11.5–15.5)
WBC: 7.5 10*3/uL (ref 4.0–10.5)
nRBC: 0 % (ref 0.0–0.2)

## 2019-06-28 LAB — COMPREHENSIVE METABOLIC PANEL
ALT: 36 U/L (ref 0–44)
AST: 21 U/L (ref 15–41)
Albumin: 4 g/dL (ref 3.5–5.0)
Alkaline Phosphatase: 76 U/L (ref 38–126)
Anion gap: 10 (ref 5–15)
BUN: 16 mg/dL (ref 6–20)
CO2: 23 mmol/L (ref 22–32)
Calcium: 8.7 mg/dL — ABNORMAL LOW (ref 8.9–10.3)
Chloride: 105 mmol/L (ref 98–111)
Creatinine, Ser: 0.98 mg/dL (ref 0.61–1.24)
GFR calc Af Amer: >60 mL/min (ref >60)
GFR calc non Af Amer: >60 mL/min (ref >60)
Glucose, Bld: 128 mg/dL — ABNORMAL HIGH (ref 70–99)
Potassium: 3.7 mmol/L (ref 3.5–5.1)
Sodium: 138 mmol/L (ref 135–145)
Total Bilirubin: 0.6 mg/dL (ref 0.3–1.2)
Total Protein: 6.9 g/dL (ref 6.5–8.1)

## 2019-06-28 LAB — TROPONIN I (HIGH SENSITIVITY): Troponin I (High Sensitivity): 3 ng/L (ref <18)

## 2019-06-28 MED ORDER — SODIUM CHLORIDE 0.9 % IV BOLUS
500.0000 mL | Freq: Once | INTRAVENOUS | Status: AC
  Administered 2019-06-28: 500 mL via INTRAVENOUS

## 2019-06-28 NOTE — ED Notes (Addendum)
Orthostatic vitals  Lying: 121/76 (90) Sitting: 123/98 (107) Standing: 135/93 (102)

## 2019-06-28 NOTE — ED Provider Notes (Signed)
[facility] EMERGENCY DEPARTMENT Provider Note   CSN: [id number] Arrival date & time: 06/28/19  1926     History   Chief Complaint Chief Complaint  Patient presents with   Blurry Vision    HPI Caleb Arias is a 43 y.o. male.     Caleb Arias is a 43 y.o. male with history of previous PE, hyperlipidemia, atypical chest pain, obesity and tobacco abuse, who presents to the ED via EMS for evaluation of episode or patient became lightheaded and had some blurred vision.  Patient reports episode occurred around 7 PM he reports he was standing up and started to feel a bit lightheaded and noted some blurring of his peripheral vision.  He reports symptoms have since resolved and his vision feels normal.  He reports with episode he also had some intermittent palpitations where he felt like his heart was beating more intensely but not fast.  He has had some intermittent chest pains.  He denies shortness of breath.  Patient reports he has had several similar episodes previously, and has been evaluated in the ED and with his PCP for the same.  Patient reports these episodes initially started when he was diagnosed with a PE in August, he was placed on Eliquis, he is return to the ED since then and had CT scan that showed resolution of his PE and he has been compliant and not missed any doses of Eliquis.  Patient is currently wearing an event monitor ordered by his PCP to evaluate for his intermittent palpitations.     Past Medical History:  Diagnosis Date   Pulmonary emboli (HCC)    Tobacco abuse     Patient Active Problem List   Diagnosis Date Noted   Pulmonary embolism (HCC) 05/01/2019   Atypical chest pain 05/01/2019   IGT (impaired glucose tolerance) 04/22/2019   Hyperlipidemia 04/22/2019   Obesity (BMI 30.0-34.9) 03/11/2019   Vitamin D deficiency 03/11/2019   Tobacco abuse     History reviewed. No pertinent surgical history.      Home Medications      Prior to Admission medications   Medication Sig Start Date End Date Taking? Authorizing Provider  buPROPion (WELLBUTRIN XL) 150 MG 24 hr tablet Take 1 tablet (150 mg total) by mouth daily. 05/12/19   Philip Aspen, Limmie Patricia, MD  ELIQUIS 5 MG TABS tablet Take 1 tablet (5 mg total) by mouth 2 (two) times daily. 05/12/19   Philip Aspen, Limmie Patricia, MD  pantoprazole (PROTONIX) 40 MG tablet Take 1 tablet (40 mg total) by mouth daily. 05/12/19   Philip Aspen, Limmie Patricia, MD  Vitamin D, Ergocalciferol, (DRISDOL) 1.25 MG (50000 UT) CAPS capsule Take 1 capsule (50,000 Units total) by mouth every 7 (seven) days for 12 doses. 06/24/19 09/10/19  Henderson Cloud, MD    Family History No family history on file.  Social History Social History   Tobacco Use   Smoking status: Current Every Day Smoker    Packs/day: 1.50    Types: Cigarettes   Smokeless tobacco: Never Used  Substance Use Topics   Alcohol use: Yes   Drug use: Never     Allergies   Asa [aspirin]   Review of Systems Review of Systems  Constitutional: Negative for chills and fever.  Eyes: Positive for visual disturbance.  Respiratory: Negative for cough and shortness of breath.   Cardiovascular: Positive for chest pain and palpitations. Negative for leg swelling.  Gastrointestinal: Negative for abdominal pain, nausea and  vomiting.  Genitourinary: Negative for dysuria.  Musculoskeletal: Negative for arthralgias and myalgias.  Skin: Negative for color change and rash.  Neurological: Positive for light-headedness. Negative for dizziness, syncope, speech difficulty, weakness, numbness and headaches.  All other systems reviewed and are negative.    Physical Exam Updated Vital Signs BP 139/79 (BP Location: Right Arm)    Pulse 85    Temp 98.5 F (36.9 C) (Oral)    Resp 16    Ht 5\' 11"  (1.803 m)    Wt 111.1 kg    SpO2 97%    BMI 34.17 kg/m   Physical Exam Vitals signs and nursing note reviewed.   Constitutional:      General: He is not in acute distress.    Appearance: Normal appearance. He is well-developed. He is obese. He is not diaphoretic.     Comments: Well-appearing and in no distress.  HENT:     Head: Normocephalic and atraumatic.     Mouth/Throat:     Mouth: Mucous membranes are moist.     Pharynx: Oropharynx is clear.  Eyes:     General:        Right eye: No discharge.        Left eye: No discharge.     Extraocular Movements: Extraocular movements intact.     Conjunctiva/sclera: Conjunctivae normal.     Pupils: Pupils are equal, round, and reactive to light.     Comments: PERRLA, EOMI, no nystagmus Peripheral fields full and intact bilaterally.  Neck:     Musculoskeletal: Neck supple.  Cardiovascular:     Rate and Rhythm: Normal rate and regular rhythm.     Heart sounds: Normal heart sounds.  Pulmonary:     Effort: Pulmonary effort is normal. No respiratory distress.     Breath sounds: Normal breath sounds. No wheezing or rales.     Comments: Respirations equal and unlabored, patient able to speak in full sentences, lungs clear to auscultation bilaterally Abdominal:     General: Bowel sounds are normal. There is no distension.     Palpations: Abdomen is soft. There is no mass.     Tenderness: There is no abdominal tenderness. There is no guarding.     Comments: Abdomen soft, nondistended, nontender to palpation in all quadrants without guarding or peritoneal signs  Musculoskeletal:        General: No deformity.     Right lower leg: No edema.     Left lower leg: No edema.  Skin:    General: Skin is warm and dry.     Capillary Refill: Capillary refill takes less than 2 seconds.  Neurological:     Mental Status: He is alert and oriented to person, place, and time.     Coordination: Coordination normal.     Comments: Speech is clear, able to follow commands CN III-XII intact Normal strength in upper and lower extremities bilaterally including dorsiflexion  and plantar flexion, strong and equal grip strength Sensation normal to light and sharp touch Moves extremities without ataxia, coordination intact No pronator drift   Psychiatric:        Mood and Affect: Mood normal.        Behavior: Behavior normal.      ED Treatments / Results  Labs (all labs ordered are listed, but only abnormal results are displayed) Labs Reviewed  COMPREHENSIVE METABOLIC PANEL - Abnormal; Notable for the following components:      Result Value   Glucose, Bld 128 (*)  Calcium 8.7 (*)    All other components within normal limits  CBC WITH DIFFERENTIAL/PLATELET  URINALYSIS, ROUTINE W REFLEX MICROSCOPIC  TROPONIN I (HIGH SENSITIVITY)    EKG None  Radiology No results found.  Procedures Procedures (including critical care time)  Medications Ordered in ED Medications  sodium chloride 0.9 % bolus 500 mL (has no administration in time range)     Initial Impression / Assessment and Plan / ED Course  I have reviewed the triage vital signs and the nursing notes.  Pertinent labs & imaging results that were available during my care of the patient were reviewed by me and considered in my medical decision making (see chart for details).  43 year old male presents for evaluation of episode of palpitations associated with lightheadedness and some blurry vision, he also had an episode of left-sided chest pain associated with the symptoms, this occurred around 7 PM.  Patient reports symptoms have since resolved.  On exam heart with regular rhythm and lungs clear to auscultation, patient with normal vitals.  Vision has returned to normal and he has no visual field deficits.  He denies any associated headache.  Reports feeling lightheaded has not had syncopal episodes.  He denies dizziness.  No other neurologic deficits noted on exam.  Patient has had multiple similar episodes in the past which started when patient was diagnosed with PE, of note this was also about  the same time patient started Wellbutrin.  He has been seen and evaluated for the symptoms previously and has been seen by his PCP as well, has had reassuring cardiac work-up including a recent coronary CT which was clear.  Patient is currently wearing an event monitor ordered by his PCP.  I question whether the symptoms could be caused by Wellbutrin as it is often known to cause palpitations and can cause some lightheaded or foggy headed feeling in many patients.  Patient has been lingering since fall, I have encouraged him to discuss this with his PCP and consider tapering off of this medication to see if these episodes resolve.  His work-up today is reassuring he has no leukocytosis and normal hemoglobin, glucose of 128 and no other significant electrolyte derangements, normal renal and liver function, urinalysis is unremarkable.  Troponin is negative, given that pain occurred over 3 hours ago and has not returned I do not feel that a second troponin is indicated especially in the setting of recent clear coronary CT, I have extremely low suspicion for ACS and patient is currently chest pain-free.  Orthostatics are negative.  Patient symptoms have resolved and has not returned since being here in the ED.  Pleural effusion seem to be associated with episode of lightheadedness, and patient had no headache or other focal neurologic deficits low suspicion for stroke or other intracranial pathology as cause for this symptom.  At this time feel patient is stable for discharge home.  We will have him dsscuss tapering off of Wellbutrin with PCP.  Patient expresses understanding and agreement with this plan.  Final Clinical Impressions(s) / ED Diagnoses   Final diagnoses:  Lightheadedness  Blurred vision  Palpitations  Atypical chest pain    ED Discharge Orders    None       Legrand RamsFord, Irineo Gaulin N, PA-C 06/29/19 1216    Tilden Fossaees, Elizabeth, MD 06/30/19 1256

## 2019-06-28 NOTE — Discharge Instructions (Signed)
Your work-up today has been reassuring.  Continue taking your medications regularly.  I wonder if the symptoms you have been experiencing could be side effects from your Wellbutrin since these seem to start around the time you started this medication.  This medication needs to be tapered off and not stopped abruptly so please discuss this with your primary care doctor.   Since you have been seen previously with similar symptoms and CT scan showed that PE has resolved and you continue to take your Eliquis low suspicion for this today.  Your heart enzymes look good.   Please return to the ED for any new or worsening symptoms.

## 2019-06-28 NOTE — ED Triage Notes (Addendum)
Pt to ED via [facility] with c/o blurry vision.  Pt st's the blurry vision is in his peripheral vision.  Pt denies any other complaint.  St's it does not feel like vertigo.  Pt does have redness to right eye and is currently on blood thinners

## 2019-06-29 ENCOUNTER — Other Ambulatory Visit: Payer: Self-pay

## 2019-06-29 NOTE — Telephone Encounter (Signed)
Forwarding to PCP for approval  

## 2019-06-29 NOTE — ED Notes (Signed)
Discharge instructions discussed with pt. Pt verbalized understanding with no questions at this time. Pt to follow up with PCP r/t Wellbutrin medication. Pt states no other questions at this time  Signature pad not available for discharge signature.

## 2019-07-01 ENCOUNTER — Ambulatory Visit: Payer: 59 | Admitting: Internal Medicine

## 2019-07-01 ENCOUNTER — Encounter: Payer: Self-pay | Admitting: Internal Medicine

## 2019-07-01 ENCOUNTER — Other Ambulatory Visit: Payer: Self-pay

## 2019-07-01 VITALS — BP 110/70 | HR 93 | Temp 97.7°F | Wt 246.7 lb

## 2019-07-01 DIAGNOSIS — R0789 Other chest pain: Secondary | ICD-10-CM

## 2019-07-01 NOTE — Progress Notes (Signed)
Established Patient Office Visit     This visit occurred during the SARS-CoV-2 public health emergency.  Safety protocols were in place, including screening questions prior to the visit, additional usage of staff PPE, and extensive cleaning of exam room while observing appropriate contact time as indicated for disinfecting solutions.    CC/Reason for Visit: ED follow-up  HPI: Caleb Arias is a 43 y.o. male who is coming in today for the above mentioned reasons.  He was again seen in the ED on 11/29 for the same symptoms of atypical chest pain, lightheadedness and blurred vision.  To recap, he had a pulmonary embolism in August and has been kept on Eliquis since then with plans to stay on it for 12 months.  He has been referred to cardiology has had a coronary CT with a calcium score of 0 and is currently wearing a 30-day event monitor placed on November 18th to evaluate for arrhythmias.  He has been very successful with quitting smoking with Wellbutrin, but the ED provider he saw mention that Wellbutrin might be causing some of his symptoms and he is wondering if it would be okay to wean off of it.  His symptoms are resolved at this point.  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   Past Medical/Surgical History: Past Medical History:  Diagnosis Date  . Pulmonary emboli ([place])   . Tobacco abuse     No past surgical history on file.  Social History:  reports that he has been smoking cigarettes. He has been smoking about 1.50 packs per day. He has never used smokeless tobacco. He reports current alcohol use. He reports that he does not use drugs.  Allergies: Allergies  Allergen Reactions  . Asa [Aspirin] Other (See Comments)    Not to take while on Eliquis    Family History:  No history of heart disease, cancer, stroke that he is aware of.  Current  Outpatient Medications:  .  acetaminophen (TYLENOL) 500 MG tablet, Take 500-1,000 mg by mouth every 6 (six) hours as needed for mild pain or headache., Disp: , Rfl:  .  buPROPion (WELLBUTRIN XL) 150 MG 24 hr tablet, TAKE 1 TABLET BY MOUTH EVERY DAY, Disp: 90 tablet, Rfl: 1 .  ELIQUIS 5 MG TABS tablet, Take 1 tablet (5 mg total) by mouth 2 (two) times daily., Disp: 60 tablet, Rfl: 2 .  pantoprazole (PROTONIX) 40 MG tablet, Take 1 tablet (40 mg total) by mouth daily. (Patient taking differently: Take 40 mg by mouth daily before breakfast. ), Disp: 90 tablet, Rfl: 1 .  Vitamin D, Ergocalciferol, (DRISDOL) 1.25 MG (50000 UT) CAPS capsule, Take 1 capsule (50,000 Units total) by mouth every 7 (seven) days for 12 doses., Disp: 12 capsule, Rfl: 0  Review of Systems:  Constitutional: Denies fever, chills, diaphoresis, appetite change and fatigue.  HEENT: Denies photophobia, eye pain, redness, hearing loss, ear pain, congestion, sore throat, rhinorrhea, sneezing, mouth sores, trouble swallowing, neck pain, neck stiffness and tinnitus.   Respiratory: Denies SOB, DOE, cough, chest tightness,  and wheezing.   Cardiovascular: Denies chest pain, palpitations and leg swelling.  Gastrointestinal: Denies nausea, vomiting, abdominal pain, diarrhea, constipation, blood in stool and abdominal distention.  Genitourinary: Denies dysuria, urgency, frequency, hematuria, flank pain and difficulty urinating.  Endocrine: Denies: hot or cold intolerance, sweats, changes in hair  or nails, polyuria, polydipsia. Musculoskeletal: Denies myalgias, back pain, joint swelling, arthralgias and gait problem.  Skin: Denies pallor, rash and wound.  Neurological: Denies dizziness, seizures, syncope, weakness, light-headedness, numbness and headaches.  Hematological: Denies adenopathy. Easy bruising, personal or family bleeding history  Psychiatric/Behavioral: Denies suicidal ideation, mood changes, confusion, nervousness, sleep  disturbance and agitation    Physical Exam: Vitals:   07/01/19 1350  BP: 110/70  Pulse: 93  Temp: 97.7 F (36.5 C)  TempSrc: Temporal  SpO2: 96%  Weight: 246 lb 11.2 oz (111.9 kg)    Body mass index is 34.41 kg/m.   Constitutional: NAD, calm, comfortable Eyes: PERRL, lids and conjunctivae normal ENMT: Mucous membranes are moist.  Respiratory: clear to auscultation bilaterally, no wheezing, no crackles. Normal respiratory effort. No accessory muscle use.  Cardiovascular: Regular rate and rhythm, no murmurs / rubs / gallops. No extremity edema. 2+ pedal pulses. Abdomen: no tenderness, no masses palpated. No hepatosplenomegaly. Bowel sounds positive.  Musculoskeletal: no clubbing / cyanosis. No joint deformity upper and lower extremities. Good ROM, no contractures. Normal muscle tone.  Skin: no rashes, lesions, ulcers. No induration Neurologic: Grossly intact and nonfocal Psychiatric: Normal judgment and insight. Alert and oriented x 3. Normal mood.    Impression and Plan:  Atypical chest pain -Not sure what else we can offer at this time. -If no sign of arrhythmias on event monitor, can consider anxiety as a possibility. -Although I am not clear that Wellbutrin is the cause of his symptoms as these have started before Wellbutrin, he would like to wean off.  I have advised to take every other day for 10 days and then every third day for 10 days before discontinuing altogether. -I will see him back in office after his heart monitor is reported.     Chaya Jan, MD [facility] at [facility]

## 2019-07-02 ENCOUNTER — Encounter: Payer: Self-pay | Admitting: Internal Medicine

## 2019-07-08 ENCOUNTER — Encounter: Payer: Self-pay | Admitting: Internal Medicine

## 2019-07-14 ENCOUNTER — Encounter: Payer: Self-pay | Admitting: Internal Medicine

## 2019-07-16 ENCOUNTER — Telehealth: Payer: Self-pay | Admitting: *Deleted

## 2019-07-16 NOTE — Telephone Encounter (Signed)
Copied from [place] [id number]. Topic: General - Other >> Jul 16, 2019 12:06 PM Celene Kras wrote: Reason for CRM: Pt called and is requesting to have a letter stating what his diagnosis, treatment, and medication regiment for eliquis. For pts DOT physical. Pt is requesting to pick this up in the office. Please advise.

## 2019-07-16 NOTE — Telephone Encounter (Signed)
Can we get more info about what exactly he wants? Would a copy of his office notes not be better?  [place]

## 2019-07-20 ENCOUNTER — Encounter (HOSPITAL_COMMUNITY): Payer: Self-pay | Admitting: Emergency Medicine

## 2019-07-20 ENCOUNTER — Telehealth: Payer: Self-pay | Admitting: Internal Medicine

## 2019-07-20 ENCOUNTER — Other Ambulatory Visit: Payer: Self-pay

## 2019-07-20 ENCOUNTER — Emergency Department (HOSPITAL_COMMUNITY): Payer: 59

## 2019-07-20 ENCOUNTER — Emergency Department (HOSPITAL_COMMUNITY)
Admission: EM | Admit: 2019-07-20 | Discharge: 2019-07-20 | Disposition: A | Discharge status: Home / Self Care | Payer: 59 | Attending: Emergency Medicine | Admitting: Emergency Medicine

## 2019-07-20 DIAGNOSIS — Z79899 Other long term (current) drug therapy: Secondary | ICD-10-CM | POA: Insufficient documentation

## 2019-07-20 DIAGNOSIS — Z7901 Long term (current) use of anticoagulants: Secondary | ICD-10-CM | POA: Insufficient documentation

## 2019-07-20 DIAGNOSIS — F17210 Nicotine dependence, cigarettes, uncomplicated: Secondary | ICD-10-CM | POA: Diagnosis not present

## 2019-07-20 DIAGNOSIS — R0789 Other chest pain: Secondary | ICD-10-CM | POA: Insufficient documentation

## 2019-07-20 DIAGNOSIS — R079 Chest pain, unspecified: Secondary | ICD-10-CM

## 2019-07-20 LAB — TROPONIN I (HIGH SENSITIVITY)
Troponin I (High Sensitivity): 3 ng/L (ref <18)
Troponin I (High Sensitivity): 2 ng/L (ref <18)

## 2019-07-20 LAB — CBC
HCT: 47.5 % (ref 39.0–52.0)
Hemoglobin: 16.1 g/dL (ref 13.0–17.0)
MCH: 29.7 pg (ref 26.0–34.0)
MCHC: 33.9 g/dL (ref 30.0–36.0)
MCV: 87.6 fL (ref 80.0–100.0)
Platelets: 151 10*3/uL (ref 150–400)
RBC: 5.42 MIL/uL (ref 4.22–5.81)
RDW: 11.9 % (ref 11.5–15.5)
WBC: 7.6 10*3/uL (ref 4.0–10.5)
nRBC: 0 % (ref 0.0–0.2)

## 2019-07-20 LAB — BASIC METABOLIC PANEL
Anion gap: 10 (ref 5–15)
BUN: 11 mg/dL (ref 6–20)
CO2: 25 mmol/L (ref 22–32)
Calcium: 8.8 mg/dL — ABNORMAL LOW (ref 8.9–10.3)
Chloride: 104 mmol/L (ref 98–111)
Creatinine, Ser: 1.13 mg/dL (ref 0.61–1.24)
GFR calc Af Amer: >60 mL/min (ref >60)
GFR calc non Af Amer: >60 mL/min (ref >60)
Glucose, Bld: 128 mg/dL — ABNORMAL HIGH (ref 70–99)
Potassium: 3.5 mmol/L (ref 3.5–5.1)
Sodium: 139 mmol/L (ref 135–145)

## 2019-07-20 MED ORDER — SODIUM CHLORIDE 0.9% FLUSH: 3.0000 mL | Freq: Once | INTRAVENOUS | Status: DC

## 2019-07-20 NOTE — Telephone Encounter (Signed)
Patient needs a letter explaining why he is on eliquis. He needs documentation from his treating physician as to when the condition started, what was the diagnosis, what has the treatment consisted of, what medication is he on, and is the condition stable.  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 [place], [place] [address]  [phone] is the MEDIQ number

## 2019-07-20 NOTE — Discharge Instructions (Signed)
Work-up today including labs, EKG, and chest x-ray were reassuring. Please follow-up with your primary care doctor. Hopefully results from your event monitor may give further details as to what is causing your chest pain. Return here for any new/acute changes.

## 2019-07-20 NOTE — ED Provider Notes (Signed)
[facility] EMERGENCY DEPARTMENT Provider Note   CSN: [id number] Arrival date & time: 07/20/19  3818     History Chief Complaint  Patient presents with  . Chest Pain    Caleb Arias is a 43 y.o. male.  The history is provided by the patient and medical records.  Chest Pain   43 y.o. M with hx of PE on Eliquis, presenting to the ED with chest pain.  Patient states since diagnosis of his PE in August 2020 he has had recurrent episodes of chest pain.  States most of the time this seems to occur at night, but not always.  States it is not usually very significant pain, more like a "twinge" that is sharp enough for him to notice.  This usually is brief and will resolve spontaneously.  States he has been evaluated for this quite a few times without any real explanation.  States tonight he felt like he was asleep in bed when he started having pain in the left side of his chest, left forearm, and left leg.  States he got concerned he got his car drive to the hospital, got about halfway to the ER when he felt like pain was worsening so he pulled into the fire station.  States once he got in front of the paramedic pains began dissipating.  His blood pressure was elevated during the episode.  He currently denies any chest pain, shortness of breath, diaphoresis, nausea, or vomiting.  States he is not sure if his symptoms are due to anxiety.  He does admit to worrying about having recurrent chest pain or having something else bad happen to him.  He has had follow-up with cardiology including coronary CT which did not show any plaque.  He also just finished a 30-day event monitor on Friday it should have the results in about 2 weeks.  He is also stop smoking, just finished tapering off his Wellbutrin today.  He is also trying to exercise and follow a better diet.  States he generally does not have any pain with exercise, does not have extreme shortness of breath, or any other exertional  type symptoms.  He has been compliant with his Eliquis.  Past Medical History:  Diagnosis Date  . Pulmonary emboli ([place])   . Tobacco abuse     Patient Active Problem List   Diagnosis Date Noted  . Pulmonary embolism ([place]) 05/01/2019  . Atypical chest pain 05/01/2019  . IGT (impaired glucose tolerance) 04/22/2019  . Hyperlipidemia 04/22/2019  . Obesity (BMI 30.0-34.9) 03/11/2019  . Vitamin D deficiency 03/11/2019  . Tobacco abuse     History reviewed. No pertinent surgical history.     No family history on file.  Social History   Tobacco Use  . Smoking status: Current Every Day Smoker    Packs/day: 1.50    Types: Cigarettes  . Smokeless tobacco: Never Used  Substance Use Topics  . Alcohol use: Yes  . Drug use: Never    Home Medications Prior to Admission medications   Medication Sig Start Date End Date Taking? Authorizing Provider  acetaminophen (TYLENOL) 500 MG tablet Take 500-1,000 mg by mouth every 6 (six) hours as needed for mild pain or headache.    [provider]  buPROPion (WELLBUTRIN XL) 150 MG 24 hr tablet TAKE 1 TABLET BY MOUTH EVERY DAY 06/30/19   Isaac Bliss, Rayford Halsted, MD  ELIQUIS 5 MG TABS tablet Take 1 tablet (5 mg total) by mouth 2 (two)  times daily. 05/12/19   Philip AspenHernandez Acosta, Limmie PatriciaEstela Y, MD  pantoprazole (PROTONIX) 40 MG tablet Take 1 tablet (40 mg total) by mouth daily. Patient taking differently: Take 40 mg by mouth daily before breakfast.  05/12/19   Philip AspenHernandez Acosta, Limmie PatriciaEstela Y, MD  Vitamin D, Ergocalciferol, (DRISDOL) 1.25 MG (50000 UT) CAPS capsule Take 1 capsule (50,000 Units total) by mouth every 7 (seven) days for 12 doses. 06/24/19 09/10/19  Philip AspenHernandez Acosta, Limmie PatriciaEstela Y, MD    Allergies    Asa [aspirin]  Review of Systems   Review of Systems  Cardiovascular: Positive for chest pain.  All other systems reviewed and are negative.   Physical Exam Updated Vital Signs BP 113/89 (BP Location: Right Arm)   Pulse 77   Temp 97.7 F  (36.5 C) (Oral)   Resp (!) 25   SpO2 96%   Physical Exam Vitals and nursing note reviewed.  Constitutional:      Appearance: He is well-developed.  HENT:     Head: Normocephalic and atraumatic.  Eyes:     Conjunctiva/sclera: Conjunctivae normal.     Pupils: Pupils are equal, round, and reactive to light.  Cardiovascular:     Rate and Rhythm: Normal rate and regular rhythm.     Heart sounds: Normal heart sounds.  Pulmonary:     Effort: Pulmonary effort is normal.     Breath sounds: Normal breath sounds. No decreased breath sounds, wheezing or rhonchi.  Abdominal:     General: Bowel sounds are normal.     Palpations: Abdomen is soft.  Musculoskeletal:        General: Normal range of motion.     Cervical back: Normal range of motion.     Comments: No lower extremity edema, calf tenderness, or asymmetry noted  Skin:    General: Skin is warm and dry.  Neurological:     Mental Status: He is alert and oriented to person, place, and time.     ED Results / Procedures / Treatments   Labs (all labs ordered are listed, but only abnormal results are displayed) Labs Reviewed  BASIC METABOLIC PANEL - Abnormal; Notable for the following components:      Result Value   Glucose, Bld 128 (*)    Calcium 8.8 (*)    All other components within normal limits  CBC  TROPONIN I (HIGH SENSITIVITY)  TROPONIN I (HIGH SENSITIVITY)    EKG EKG Interpretation  Date/Time:  Monday July 20 2019 02:21:59 EST Ventricular Rate:  76 PR Interval:  120 QRS Duration: 104 QT Interval:  394 QTC Calculation: 443 R Axis:   77 Text Interpretation: Normal sinus rhythm Normal ECG When compared with ECG of 05/05/2019, No significant change was found Confirmed by Dione BoozeGlick, David ([id number]) on 07/20/2019 2:31:34 AM   Radiology DG Chest 2 View  Result Date: 07/20/2019 CLINICAL DATA:  Lightheadedness. EXAM: CHEST - 2 VIEW COMPARISON:  June 28, 2019 FINDINGS: The heart size and mediastinal contours are  within normal limits. Both lungs are clear. The visualized skeletal structures are unremarkable. IMPRESSION: No active cardiopulmonary disease. Electronically Signed   By: Katherine Mantlehristopher  Green M.D.   On: 07/20/2019 02:42    Procedures Procedures (including critical care time)  Medications Ordered in ED Medications  sodium chloride flush (NS) 0.9 % injection 3 mL (has no administration in time range)    ED Course  I have reviewed the triage vital signs and the nursing notes.  Pertinent labs & imaging results that were available during  my care of the patient were reviewed by me and considered in my medical decision making (see chart for details).    MDM Rules/Calculators/A&P  43 year old male here after episode of chest pain.  He has had multiple episodes of the same over the past few months since diagnosis with PE in August.  He is currently anticoagulated with Xarelto.  Symptoms somewhat atypical in nature, associated with forearm pain, leg discomfort, brief blurred vision.  Symptoms very brief and resolving spontaneously.  Not necessarily associated with exertion.  Has had evaluation with cardiology already and essentially cleared from their practice.  He just finished wearing a 30-day Holter monitor from his PCP.  EKG here without any acute ischemic changes, similar to prior.  Labs reassuring including troponin x2.  Chest x-ray is clear.  I do question if there is a component of anxiety related to his symptoms.  He expresses that since diagnosis of his PE he has been hyper aware of any symptoms that he may be feeling.  He does report feeling anxious about having another episode of chest pain and what it may mean.  Given negative work-up, feel he stable for discharge home.  He will follow closely with his PCP, has appointment upcoming soon to discuss results of event monitor.  He may return here for any new or acute changes.  Final Clinical Impression(s) / ED Diagnoses Final diagnoses:  Chest  pain in adult    Rx / DC Orders ED Discharge Orders    None       Garlon Hatchet, PA-C 07/20/19 0556    Marily Memos, MD 07/20/19 [phone]

## 2019-07-20 NOTE — ED Triage Notes (Signed)
Pt arrives via [facility], ems states he [place] c/o chest tightness, lightheadedness and dizziness. Pt has had multiple tests done for the same complaints over the past few months and has not had any significant findings. 12 lead unremarkable. Skin warm and dry, denies sob. resp e/u, nad. Hx of PE in the past. Pt refused asa d/t being on eloquis. 18g IV in LAC pta.

## 2019-07-21 NOTE — Telephone Encounter (Signed)
Letter faxed.

## 2019-07-28 ENCOUNTER — Telehealth: Payer: Self-pay | Admitting: *Deleted

## 2019-07-28 ENCOUNTER — Telehealth: Payer: Self-pay | Admitting: Cardiovascular Disease

## 2019-07-28 NOTE — Telephone Encounter (Signed)
New Message  Patient is calling in to go over event monitor results. Please give patient a call back with results.

## 2019-07-28 NOTE — Telephone Encounter (Signed)
Spoke with patient. Patient notified once results are available a staff member will contact patient to discuss. Patient verbalized understanding.

## 2019-07-28 NOTE — Telephone Encounter (Signed)
Copied from [place] [id number]. Topic: General - Other >> Jul 28, 2019  1:50 PM Leward Quan A wrote: Reason for CRM: Patient called to inform Dr Jerilee Hoh that he had an episode where he had to leave work early on 07/27/2019 light headed, mind racing kind of a panic attack. Patient also would like a call back to discuss result of heart monitor that he wore need to know if he should get an appointment set up or can he get a phone call to discuss. Ph# [phone]

## 2019-07-28 NOTE — Telephone Encounter (Signed)
FYI  Spoke with patient and he is aware that Dr Jerilee Hoh is out of the office.  Advised patient to call Dr Gwenlyn Found for advise.  Also advised patient to go to the ER if symptoms worsen or do not improve. Virtual appointment 08/07/2019 per patient request.

## 2019-07-30 ENCOUNTER — Ambulatory Visit (INDEPENDENT_AMBULATORY_CARE_PROVIDER_SITE_OTHER): Payer: 59 | Admitting: Physician Assistant

## 2019-07-30 ENCOUNTER — Encounter: Payer: Self-pay | Admitting: Physician Assistant

## 2019-07-30 ENCOUNTER — Other Ambulatory Visit: Payer: Self-pay

## 2019-07-30 VITALS — BP 102/72 | HR 89 | Ht 71.0 in | Wt 249.2 lb

## 2019-07-30 DIAGNOSIS — Z720 Tobacco use: Secondary | ICD-10-CM

## 2019-07-30 DIAGNOSIS — R0789 Other chest pain: Secondary | ICD-10-CM | POA: Diagnosis not present

## 2019-07-30 DIAGNOSIS — R42 Dizziness and giddiness: Secondary | ICD-10-CM | POA: Diagnosis not present

## 2019-07-30 NOTE — Patient Instructions (Signed)
Medication Instructions:  The current medical regimen is effective;  continue present plan and medications as directed. Please refer to the Current Medication list given to you today. If you need a refill on your cardiac medications before your next appointment, please call your pharmacy.  Follow-Up: IN 12 months Please call our office 2 months in advance, OCT 2021 to schedule this [place] 2021 appointment. In Person You may see Quay Burow, MD or one of the following Advanced Practice Providers on your designated Care Team:  Kerin Ransom, PA-C [place], [place] Coletta Memos, [place].    Reduce your risk of getting COVID-19 With your heart disease it is especially important for people at increased risk of severe illness from COVID-19, and those who live with them, to protect themselves from getting COVID-19. The best way to protect yourself and to help reduce the spread of the virus that causes COVID-19 is to: Marland Kitchen Limit your interactions with other people as much as possible. . Take precautions to prevent getting COVID-19 when you do interact with others. If you start feeling sick and think you may have COVID-19, get in touch with your healthcare provider within 24  At [facility], you and your health needs are our priority.  As part of our continuing mission to provide you with exceptional heart care, we have created designated Provider Care Teams.  These Care Teams include your primary Cardiologist (physician) and Advanced Practice Providers (APPs -  Physician Assistants and Nurse Practitioners) who all work together to provide you with the care you need, when you need it.  Thank you for choosing [facility] at [facility]!!        Happy Holidays!!

## 2019-07-30 NOTE — Progress Notes (Signed)
Cardiology Office Note   Date:  07/30/2019   ID:  Caleb SoJason Penado, DOB 10-Nov-1975, MRN [id number]  PCP:  Philip AspenHernandez Acosta, Limmie PatriciaEstela Y, MD Cardiologist:  Nanetta BattyJonathan Berry, MD 05/01/2019 Electrphysiologist: None Theodore Demarkhonda Reagen Haberman, PA-C   No chief complaint on file.   History of Present Illness: Caleb Arias is a 43 y.o. male with a history of PE 03/04/2019 on Eliquis, tob use, palpitations, chest pain w/ nl cardiac CT.  S/p Monitor w/ ST 152, Sbrady>rare hr 47, 3 sec pause w/out sx.  Caleb Arias presents for cardiology follow up.  His attacks are all the same. They start without direct provocation, but in the setting of increased stress. He will feel a fullness in his heart and then it empties, he will feel light-headed. He will get some L chest pain, L shoulder pain and some neck pain. He will also sometimes get pain in his forearm.   These sx started after being diagnosed with PE. He wonders if these sx are panic attacks or otherwise from anxiety.   He has had another CT for PE, the PE had resolved.   He has not been able to go back to work yet because of these episodes.   He tried to go back to work this week.  He drove down to pick up his truck, no problem.  However, after getting on the highway, he had a severe episode of feeling lightheaded and feeling his heart pound.  He did not feel safe driving and had to stop the truck and call somebody to come and pick him up.  He looks forward to getting back to work.  He is relieved to learn that his monitor did not show anything critically wrong with his heart.   Past Medical History:  Diagnosis Date  . Pulmonary emboli ([facility])   . Tobacco abuse     No past surgical history on file.  Current Outpatient Medications  Medication Sig Dispense Refill  . acetaminophen (TYLENOL) 500 MG tablet Take 500-1,000 mg by mouth every 6 (six) hours as needed for mild pain or headache.    Marland Kitchen. ELIQUIS 5 MG TABS tablet Take 1 tablet (5 mg total) by  mouth 2 (two) times daily. 60 tablet 2  . pantoprazole (PROTONIX) 40 MG tablet Take 1 tablet (40 mg total) by mouth daily. (Patient taking differently: Take 40 mg by mouth daily before breakfast. ) 90 tablet 1  . Vitamin D, Ergocalciferol, (DRISDOL) 1.25 MG (50000 UT) CAPS capsule Take 1 capsule (50,000 Units total) by mouth every 7 (seven) days for 12 doses. 12 capsule 0   No current facility-administered medications for this visit.    Allergies:   Asa [aspirin]    Social History:  The patient  reports that he has been smoking cigarettes. He has been smoking about 1.50 packs per day. He has never used smokeless tobacco. He reports current alcohol use. He reports that he does not use drugs.   Family History:  The patient's family history is not on file.  has no family status information on file.     ROS:  Please see the history of present illness. All other systems are reviewed and negative.    PHYSICAL EXAM: VS:  BP 102/72   Pulse 89   Ht 5\' 11"  (1.803 m)   Wt 249 lb 3.2 oz (113 kg)   SpO2 96%   BMI 34.76 kg/m  , BMI Body mass index is 34.76 kg/m. GEN: Well nourished, well developed, male  in no acute distress HEENT: normal for age  Neck: no JVD, no carotid bruit, no masses Cardiac: RRR; no murmur, no rubs, or gallops Respiratory:  clear to auscultation bilaterally, normal work of breathing GI: soft, nontender, nondistended, + BS MS: no deformity or atrophy; no edema; distal pulses are 2+ in all 4 extremities  Skin: warm and dry, no rash Neuro:  Strength and sensation are intact Psych: euthymic mood, full affect   EKG:  EKG is ordered today. The ekg ordered today demonstrates SR, HR 82, QT recorded as prolonged at 474 milliseconds, but was 404 ms by my measurement  CARDIAC CTA: 08/21/2018  MONITOR: 07/28/2019 Sinus rhythm, sinus tach, sinus bradycardia No A. fib Lowest heart rate 47, highest heart rate 152 3-second pause after a couple of PACs and a nonconducted P  wave (asymptomatic) All patient stimulated events associated with palpitations, chest pain, lightheadedness, or shortness of breath, were sinus rhythm or sinus tach at less than 120 bpm   Recent Labs: 04/21/2019: B Natriuretic Peptide 18.7 04/28/2019: TSH 1.968 06/28/2019: ALT 36 07/20/2019: BUN 11; Creatinine, Ser 1.13; Hemoglobin 16.1; Platelets 151; Potassium 3.5; Sodium 139  CBC    Component Value Date/Time   WBC 7.6 07/20/2019 0223   RBC 5.42 07/20/2019 0223   HGB 16.1 07/20/2019 0223   HCT 47.5 07/20/2019 0223   PLT 151 07/20/2019 0223   MCV 87.6 07/20/2019 0223   MCH 29.7 07/20/2019 0223   MCHC 33.9 07/20/2019 0223   RDW 11.9 07/20/2019 0223   LYMPHSABS 2.1 06/28/2019 1945   MONOABS 0.4 06/28/2019 1945   EOSABS 0.2 06/28/2019 1945   BASOSABS 0.1 06/28/2019 1945   CMP Latest Ref Rng & Units 07/20/2019 06/28/2019 05/05/2019  Glucose 70 - 99 mg/dL 128(H) 128(H) 80  BUN 6 - 20 mg/dL 11 16 14   Creatinine 0.61 - 1.24 mg/dL 1.13 0.98 0.96  Sodium 135 - 145 mmol/L 139 138 139  Potassium 3.5 - 5.1 mmol/L 3.5 3.7 3.7  Chloride 98 - 111 mmol/L 104 105 105  CO2 22 - 32 mmol/L 25 23 22   Calcium 8.9 - 10.3 mg/dL 8.8(L) 8.7(L) 9.1  Total Protein 6.5 - 8.1 g/dL - 6.9 -  Total Bilirubin 0.3 - 1.2 mg/dL - 0.6 -  Alkaline Phos 38 - 126 U/L - 76 -  AST 15 - 41 U/L - 21 -  ALT 0 - 44 U/L - 36 -     Lipid Panel Lab Results  Component Value Date   CHOL 157 03/11/2019   HDL 40.50 03/11/2019   LDLCALC 96 03/11/2019   TRIG 103.0 03/11/2019   CHOLHDL 4 03/11/2019      Wt Readings from Last 3 Encounters:  07/30/19 249 lb 3.2 oz (113 kg)  07/01/19 246 lb 11.2 oz (111.9 kg)  06/28/19 245 lb (111.1 kg)     Other studies Reviewed: Additional studies/ records that were reviewed today include: Office notes, hospital records and testing.  ASSESSMENT AND PLAN:  1.  Episodic lightheadedness, chest pain -The lightheaded feeling is associated with heart pounding, and sometimes chest  pain. -No CAD at cardiac CT, no significant arrhythmia is associated with his symptoms on the monitor -I explained that I was very glad he had symptoms while he was wearing the monitor so that we could prove that his heart rate and rhythm was stable -Cardiac spasm is possible, but very unlikely.  I feel Dr. Jerilee Hoh should evaluate him for anxiety or panic attacks prior to trying Imdur for possible  spasm. -No further cardiac work-up is indicated -Consider anxiety and possible panic attacks. -As his symptoms started after he was diagnosed with PE, and have not resolved even though the PE was completely treated, there may be anxiety associated with the diagnosis.    Current medicines are reviewed at length with the patient today.  The patient does not have concerns regarding medicines.  The following changes have been made:  no change  Labs/ tests ordered today include:  No orders of the defined types were placed in this encounter.    Disposition:   FU with Nanetta Batty, MD yearly  Signed, Theodore Demark, PA-C  07/30/2019 8:55 AM    [facility] Phone: [phone]; Fax: [phone]

## 2019-07-31 ENCOUNTER — Encounter: Payer: Self-pay | Admitting: Internal Medicine

## 2019-08-07 ENCOUNTER — Other Ambulatory Visit: Payer: Self-pay

## 2019-08-07 ENCOUNTER — Telehealth (INDEPENDENT_AMBULATORY_CARE_PROVIDER_SITE_OTHER): Payer: 59 | Admitting: Internal Medicine

## 2019-08-07 DIAGNOSIS — F411 Generalized anxiety disorder: Secondary | ICD-10-CM | POA: Diagnosis not present

## 2019-08-07 MED ORDER — SERTRALINE HCL 50 MG PO TABS: 50.0000 mg | ORAL_TABLET | Freq: Every day | ORAL | 1 refills | Status: DC

## 2019-08-07 NOTE — Progress Notes (Signed)
Virtual Visit via Video Note  I connected with Caleb Arias on 08/07/19 at  3:00 PM EST by a video enabled telemedicine application and verified that I am speaking with the correct person using two identifiers.  Location patient: home Location provider: work office Persons participating in the virtual visit: patient, provider  I discussed the limitations of evaluation and management by telemedicine and the availability of in person appointments. The patient expressed understanding and agreed to proceed.   HPI: Caleb Arias is here today to follow-up on his atypical chest pain.  To recap, he had a pulmonary embolism in August and has been kept on Eliquis since then with plans to stay on it for 12 months.  He has been referred to cardiology, has had a coronary CT with a calcium score of 0, he also had a 30-day event monitor without abnormalities when triggered.  He has now completely weaned off the Wellbutrin as he was concerned that that could have been a potential cause.  No change in symptoms.  He is now working in [place] which is a couple hours drive for him.  Just last week while getting on the highway he felt 1 of these "episodes" come on.  He describes them as a sensation of tightening in his upper chest that spreads up into his neck with a feeling of heart racing.  With negative cardiac work-up, cardiology has suggested that he be treated for anxiety to see if this provides any relief of his symptoms.  He has been checking his CBGs, last few have been 90, 100, 76, 110.  ROS: Constitutional: Denies fever, chills, diaphoresis, appetite change and fatigue.  HEENT: Denies photophobia, eye pain, redness, hearing loss, ear pain, congestion, sore throat, rhinorrhea, sneezing, mouth sores, trouble swallowing, neck pain, neck stiffness and tinnitus.   Respiratory: Denies SOB, DOE, cough, chest tightness,  and wheezing.   Cardiovascular: Denies chest pain, palpitations and leg swelling.    Gastrointestinal: Denies nausea, vomiting, abdominal pain, diarrhea, constipation, blood in stool and abdominal distention.  Genitourinary: Denies dysuria, urgency, frequency, hematuria, flank pain and difficulty urinating.  Endocrine: Denies: hot or cold intolerance, sweats, changes in hair or nails, polyuria, polydipsia. Musculoskeletal: Denies myalgias, back pain, joint swelling, arthralgias and gait problem.  Skin: Denies pallor, rash and wound.  Neurological: Denies dizziness, seizures, syncope, weakness, light-headedness, numbness and headaches.  Hematological: Denies adenopathy. Easy bruising, personal or family bleeding history  Psychiatric/Behavioral: Denies suicidal ideation, mood changes, confusion, nervousness, sleep disturbance and agitation   Past Medical History:  Diagnosis Date  . Pulmonary emboli (HCC)   . Tobacco abuse     No past surgical history on file.  No family history on file.  SOCIAL HX:   reports that he has been smoking cigarettes. He has been smoking about 1.50 packs per day. He has never used smokeless tobacco. He reports current alcohol use. He reports that he does not use drugs.   Current Outpatient Medications:  .  acetaminophen (TYLENOL) 500 MG tablet, Take 500-1,000 mg by mouth every 6 (six) hours as needed for mild pain or headache., Disp: , Rfl:  .  ELIQUIS 5 MG TABS tablet, Take 1 tablet (5 mg total) by mouth 2 (two) times daily., Disp: 60 tablet, Rfl: 2 .  pantoprazole (PROTONIX) 40 MG tablet, Take 1 tablet (40 mg total) by mouth daily. (Patient taking differently: Take 40 mg by mouth daily before breakfast. ), Disp: 90 tablet, Rfl: 1 .  Vitamin D, Ergocalciferol, (DRISDOL) 1.25  MG (50000 UT) CAPS capsule, Take 1 capsule (50,000 Units total) by mouth every 7 (seven) days for 12 doses., Disp: 12 capsule, Rfl: 0 .  sertraline (ZOLOFT) 50 MG tablet, Take 1 tablet (50 mg total) by mouth daily., Disp: 90 tablet, Rfl: 1  EXAM:   VITALS per patient  if applicable: None reported  GENERAL: alert, oriented, appears well and in no acute distress  HEENT: atraumatic, conjunttiva clear, no obvious abnormalities on inspection of external nose and ears  NECK: normal movements of the head and neck  LUNGS: on inspection no signs of respiratory distress, breathing rate appears normal, no obvious gross increased work of breathing, gasping or wheezing  CV: no obvious cyanosis  MS: moves all visible extremities without noticeable abnormality  PSYCH/NEURO: pleasant and cooperative, no obvious depression or anxiety, speech and thought processing grossly intact  ASSESSMENT AND PLAN:   GAD (generalized anxiety disorder)  -Start Zoloft to see if this provides any improvement in his episodes. -He will schedule a 6-week follow-up.    I discussed the assessment and treatment plan with the patient. The patient was provided an opportunity to ask questions and all were answered. The patient agreed with the plan and demonstrated an understanding of the instructions.   The patient was advised to call back or seek an in-person evaluation if the symptoms worsen or if the condition fails to improve as anticipated.    Caleb Frohlich, MD  [facility] at [facility]

## 2019-08-08 ENCOUNTER — Encounter: Payer: Self-pay | Admitting: Internal Medicine

## 2019-08-21 ENCOUNTER — Ambulatory Visit: Payer: 59 | Admitting: Internal Medicine

## 2019-08-29 ENCOUNTER — Other Ambulatory Visit: Payer: Self-pay | Admitting: Internal Medicine

## 2019-08-29 DIAGNOSIS — I2699 Other pulmonary embolism without acute cor pulmonale: Secondary | ICD-10-CM

## 2019-09-08 ENCOUNTER — Other Ambulatory Visit: Payer: Self-pay

## 2019-09-08 ENCOUNTER — Ambulatory Visit (INDEPENDENT_AMBULATORY_CARE_PROVIDER_SITE_OTHER): Payer: 59 | Admitting: Internal Medicine

## 2019-09-08 DIAGNOSIS — J019 Acute sinusitis, unspecified: Secondary | ICD-10-CM

## 2019-09-08 DIAGNOSIS — H6692 Otitis media, unspecified, left ear: Secondary | ICD-10-CM | POA: Diagnosis not present

## 2019-09-08 DIAGNOSIS — F411 Generalized anxiety disorder: Secondary | ICD-10-CM

## 2019-09-08 NOTE — Progress Notes (Signed)
Virtual Visit via Video Note  I connected with Caleb Arias on 09/08/19 at  2:30 PM EST by a video enabled telemedicine application and verified that I am speaking with the correct person using two identifiers.  Location patient: home Location provider: work office Persons participating in the virtual visit: patient, provider  I discussed the limitations of evaluation and management by telemedicine and the availability of in person appointments. The patient expressed understanding and agreed to proceed.   HPI: He has scheduled this visit as an urgent care follow-up.  He was initially seen on January 16 and diagnosed with a sinus and left ear infection.  He was given 7 days of amoxicillin and prednisone course.  He followed up with them on January 27.  At that point was told he had allergic rhinitis and started on Allegra and Flonase.  Rapid Covid test was negative.  He also like to tell me that about 2 weeks ago while driving through [place], [place], he had another one of his episodes of chest tightness, palpitations, dizziness and lightheadedness for which he has been fully worked up for in the past.  His boss convinced him to go to the local emergency department.  He tells me he had a chest x-ray, EKG, lab work and even a CT angiogram of the chest that were all negative.  Since starting Lexapro, he has noticed marked decrease in frequency of these episodes, in fact has only had 1 since.   ROS: Constitutional: Denies fever, chills, diaphoresis, appetite change and fatigue.  HEENT: Denies photophobia, eye pain, redness, hearing loss, ear pain, congestion, sore throat, rhinorrhea, sneezing, mouth sores, trouble swallowing, neck pain, neck stiffness and tinnitus.   Respiratory: Denies SOB, DOE, cough, chest tightness,  and wheezing.   Cardiovascular: Denies chest pain, palpitations and leg swelling.  Gastrointestinal: Denies nausea, vomiting, abdominal pain, diarrhea,  constipation, blood in stool and abdominal distention.  Genitourinary: Denies dysuria, urgency, frequency, hematuria, flank pain and difficulty urinating.  Endocrine: Denies: hot or cold intolerance, sweats, changes in hair or nails, polyuria, polydipsia. Musculoskeletal: Denies myalgias, back pain, joint swelling, arthralgias and gait problem.  Skin: Denies pallor, rash and wound.  Neurological: Denies dizziness, seizures, syncope, weakness, light-headedness, numbness and headaches.  Hematological: Denies adenopathy. Easy bruising, personal or family bleeding history  Psychiatric/Behavioral: Denies suicidal ideation, mood changes, confusion, nervousness, sleep disturbance and agitation   Past Medical History:  Diagnosis Date  . Pulmonary emboli ([facility])   . Tobacco abuse     No past surgical history on file.  No family history on file.  SOCIAL HX:   reports that he has been smoking cigarettes. He has been smoking about 1.50 packs per day. He has never used smokeless tobacco. He reports current alcohol use. He reports that he does not use drugs.   Current Outpatient Medications:  .  fexofenadine (ALLEGRA) 180 MG tablet, Take by mouth., Disp: , Rfl:  .  fluticasone (FLONASE) 50 MCG/ACT nasal spray, 2 sprays by Each Nare route daily., Disp: , Rfl:  .  acetaminophen (TYLENOL) 500 MG tablet, Take 500-1,000 mg by mouth every 6 (six) hours as needed for mild pain or headache., Disp: , Rfl:  .  ELIQUIS 5 MG TABS tablet, TAKE 1 TABLET BY MOUTH TWICE A DAY, Disp: 180 tablet, Rfl: 1 .  pantoprazole (PROTONIX) 40 MG tablet, Take 1 tablet (40 mg total) by mouth daily. (Patient taking differently: Take 40 mg by mouth daily before breakfast. ), Disp: 90  tablet, Rfl: 1 .  sertraline (ZOLOFT) 50 MG tablet, Take 1 tablet (50 mg total) by mouth daily., Disp: 90 tablet, Rfl: 1 .  Vitamin D, Ergocalciferol, (DRISDOL) 1.25 MG (50000 UT) CAPS capsule, Take 1 capsule (50,000 Units total) by mouth every 7  (seven) days for 12 doses., Disp: 12 capsule, Rfl: 0  EXAM:   VITALS per patient if applicable: None reported  GENERAL: alert, oriented, appears well and in no acute distress  HEENT: atraumatic, conjunttiva clear, no obvious abnormalities on inspection of external nose and ears  NECK: normal movements of the head and neck  LUNGS: on inspection no signs of respiratory distress, breathing rate appears normal, no obvious gross increased work of breathing, gasping or wheezing  CV: no obvious cyanosis  MS: moves all visible extremities without noticeable abnormality  PSYCH/NEURO: pleasant and cooperative, no obvious depression or anxiety, speech and thought processing grossly intact  ASSESSMENT AND PLAN:   Left otitis media, unspecified otitis media type Acute non-recurrent sinusitis, unspecified location -Per patient, improved. -He has completed course of amoxicillin and prednisone. -Still on allegra and flonase. Can try stopping in 4 weeks.  GAD (generalized anxiety disorder) -Continue lexapro 50 mg.     I discussed the assessment and treatment plan with the patient. The patient was provided an opportunity to ask questions and all were answered. The patient agreed with the plan and demonstrated an understanding of the instructions.   The patient was advised to call back or seek an in-person evaluation if the symptoms worsen or if the condition fails to improve as anticipated.    Chaya Jan, MD  [facility] at [facility]

## 2019-09-22 ENCOUNTER — Other Ambulatory Visit: Payer: Self-pay | Admitting: Internal Medicine

## 2019-09-22 DIAGNOSIS — E559 Vitamin D deficiency, unspecified: Secondary | ICD-10-CM

## 2019-10-15 ENCOUNTER — Ambulatory Visit: Payer: 59 | Attending: Internal Medicine

## 2019-10-15 DIAGNOSIS — Z23 Encounter for immunization: Secondary | ICD-10-CM

## 2019-10-15 NOTE — Progress Notes (Signed)
   Covid-19 Vaccination Clinic  Name:  Caleb Arias    MRN: [id number] DOB: Aug 17, 1975  10/15/2019  Mr. [name] was observed post Covid-19 immunization for 15 minutes without incident. He was provided with Vaccine Information Sheet and instruction to access the V-Safe system.   Mr. [name] was instructed to call 911 with any severe reactions post vaccine: Marland Kitchen Difficulty breathing  . Swelling of face and throat  . A fast heartbeat  . A bad rash all over body  . Dizziness and weakness   Immunizations Administered    Name Date Dose VIS Date Route   Pfizer COVID-19 Vaccine 10/15/2019  1:36 PM 0.3 mL 07/10/2019 Intramuscular   Manufacturer: [employer], [employer]   Lot: [id number]   NDC: [address]-9

## 2019-10-16 ENCOUNTER — Telehealth: Payer: Self-pay | Admitting: Internal Medicine

## 2019-10-16 NOTE — Telephone Encounter (Signed)
We have received the form from [employer] for the request for medical records.  The request has been sent to [employer] to have them send the records to [employer].

## 2019-11-04 ENCOUNTER — Other Ambulatory Visit: Payer: Self-pay

## 2019-11-05 ENCOUNTER — Encounter: Payer: Self-pay | Admitting: Internal Medicine

## 2019-11-05 ENCOUNTER — Ambulatory Visit (INDEPENDENT_AMBULATORY_CARE_PROVIDER_SITE_OTHER): Payer: 59 | Admitting: Internal Medicine

## 2019-11-05 VITALS — BP 110/70 | HR 92 | Temp 97.0°F | Wt 250.0 lb

## 2019-11-05 DIAGNOSIS — K219 Gastro-esophageal reflux disease without esophagitis: Secondary | ICD-10-CM | POA: Diagnosis not present

## 2019-11-05 DIAGNOSIS — R7302 Impaired glucose tolerance (oral): Secondary | ICD-10-CM

## 2019-11-05 DIAGNOSIS — Z720 Tobacco use: Secondary | ICD-10-CM

## 2019-11-05 DIAGNOSIS — R0789 Other chest pain: Secondary | ICD-10-CM | POA: Diagnosis not present

## 2019-11-05 DIAGNOSIS — I2609 Other pulmonary embolism with acute cor pulmonale: Secondary | ICD-10-CM

## 2019-11-05 DIAGNOSIS — E559 Vitamin D deficiency, unspecified: Secondary | ICD-10-CM

## 2019-11-05 DIAGNOSIS — M542 Cervicalgia: Secondary | ICD-10-CM

## 2019-11-05 DIAGNOSIS — E669 Obesity, unspecified: Secondary | ICD-10-CM

## 2019-11-05 DIAGNOSIS — E785 Hyperlipidemia, unspecified: Secondary | ICD-10-CM

## 2019-11-05 DIAGNOSIS — F411 Generalized anxiety disorder: Secondary | ICD-10-CM | POA: Insufficient documentation

## 2019-11-05 DIAGNOSIS — I2782 Chronic pulmonary embolism: Secondary | ICD-10-CM

## 2019-11-05 DIAGNOSIS — H65413 Chronic allergic otitis media, bilateral: Secondary | ICD-10-CM

## 2019-11-05 MED ORDER — PANTOPRAZOLE SODIUM 40 MG PO TBEC: 40.0000 mg | DELAYED_RELEASE_TABLET | Freq: Every day | ORAL | 1 refills | Status: DC

## 2019-11-05 MED ORDER — SERTRALINE HCL 100 MG PO TABS: 100.0000 mg | ORAL_TABLET | Freq: Every day | ORAL | 1 refills | Status: DC

## 2019-11-05 NOTE — Patient Instructions (Signed)
-  Nice seeing you today!!  -Lab work today; will notify you once results are available.  -Tale allegra daily and mucinex twice daily.

## 2019-11-05 NOTE — Progress Notes (Signed)
Established Patient Office Visit     This visit occurred during the SARS-CoV-2 public health emergency.  Safety protocols were in place, including screening questions prior to the visit, additional usage of staff PPE, and extensive cleaning of exam room while observing appropriate contact time as indicated for disinfecting solutions.    CC/Reason for Visit: 35-month follow-up  HPI: Caleb Arias is a 44 y.o. male who is coming in today for the above mentioned reasons. Past Medical History is significant for:  atypical chest pain.  To recap, he had a pulmonary embolism in August and has been kept on Eliquis since then with plans to stay on it for 12 months.  He has been referred to cardiology, has had a coronary CT with a calcium score of 0, he also had a 30-day event monitor without abnormalities when triggered.  He has now completely weaned off the Wellbutrin as he was concerned that that could have been a potential cause. With negative cardiac work-up, cardiology has suggested that he be treated for anxiety to see if this provides any relief of his symptoms.  He was started on Zoloft 50 mg back in January.  Feels this has helped him a little but still has episodes especially when walking his dog he will have sharp pains over his chest and shoulder area.  At times will have blurry vision, went to have an eye exam and was told everything looked good.  He is requesting a referral to a neurologist to see if they have any insight on these issues.  He has been having sinus pressure since late winter.  He has had several courses of prednisone, antihistamines, Flonase and a course of antibiotics.  He stopped taking these medications at least a month ago.  He has disability forms that he would like me to fill out today.   Past Medical/Surgical History: Past Medical History:  Diagnosis Date  . Pulmonary emboli ([facility])   . Tobacco abuse     No past surgical history on file.  Social History:  reports that he has been smoking cigarettes. He has been smoking about 1.50 packs per day. He has never used smokeless tobacco. He reports current alcohol use. He reports that he does not use drugs.  Allergies: Allergies  Allergen Reactions  . Asa [Aspirin] Other (See Comments)    Not to take while on Eliquis    Family History:  No history of heart disease, cancer, stroke that he is    Current Outpatient Medications:  .  acetaminophen (TYLENOL) 500 MG tablet, Take 500-1,000 mg by mouth every 6 (six) hours as needed for mild pain or headache., Disp: , Rfl:  .  ELIQUIS 5 MG TABS tablet, TAKE 1 TABLET BY MOUTH TWICE A DAY, Disp: 180 tablet, Rfl: 1 .  fexofenadine (ALLEGRA) 180 MG tablet, Take by mouth., Disp: , Rfl:  .  pantoprazole (PROTONIX) 40 MG tablet, Take 1 tablet (40 mg total) by mouth daily., Disp: 90 tablet, Rfl: 1 .  sertraline (ZOLOFT) 100 MG tablet, Take 1 tablet (100 mg total) by mouth daily., Disp: 90 tablet, Rfl: 1  Review of Systems:  Constitutional: Denies fever, chills, diaphoresis, appetite change and fatigue.  HEENT: Denies photophobia, eye pain, redness, hearing loss, ear pain, congestion, sore throat, rhinorrhea, sneezing, mouth sores, trouble swallowing, neck pain, neck stiffness and tinnitus.   Respiratory: Denies cough, chest tightness,  and wheezing.   Cardiovascular: Denies  palpitations and leg swelling.  Gastrointestinal: Denies nausea, vomiting,  abdominal pain, diarrhea, constipation, blood in stool and abdominal distention.  Genitourinary: Denies dysuria, urgency, frequency, hematuria, flank pain and difficulty urinating.  Endocrine: Denies: hot or cold intolerance, sweats, changes in hair or nails, polyuria, polydipsia. Musculoskeletal: Denies myalgias, back pain, joint swelling, arthralgias and gait problem.  Skin: Denies pallor, rash and wound.  Neurological: Denies dizziness, seizures, syncope, weakness, light-headedness, numbness and headaches.    Hematological: Denies adenopathy. Easy bruising, personal or family bleeding history  Psychiatric/Behavioral: Denies suicidal ideation, mood changes, confusion, nervousness, sleep disturbance and agitation    Physical Exam: Vitals:   11/05/19 1430  BP: 110/70  Pulse: 92  Temp: (!) 97 F (36.1 C)  TempSrc: Temporal  SpO2: 96%  Weight: 250 lb (113.4 kg)    Body mass index is 34.87 kg/m.   Constitutional: NAD, calm, comfortable Eyes: PERRL, lids and conjunctivae normal ENMT: Mucous membranes are moist.  Tympanic membrane is pearly white, no erythema or bulging.  There are some discrete air-fluid levels bilaterally. Neurologic: Grossly intact and nonfocal  Psychiatric: Normal judgment and insight. Alert and oriented x 3. Normal mood.    Impression and Plan:  Vitamin D deficiency  - Plan: VITAMIN D 25 Hydroxy (Vit-D Deficiency, Fractures)  Gastroesophageal reflux disease without esophagitis  - Plan: pantoprazole (PROTONIX) 40 MG tablet  Atypical chest pain GAD (generalized anxiety disorder)  -Increase Zoloft from 50 to 100 mg, follow-up in 3 months. -Disability forms will be filled out today.  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  Hyperlipidemia, unspecified hyperlipidemia type -Last LDL was 96 in August 2020.  Not on statin.  Tobacco abuse -Not interested in cessation at this time  Obesity (BMI 30.0-34.9) -Discussed healthy lifestyle, including increased physical activity and better food choices to promote weight loss.  IGT (impaired glucose tolerance) -Last A1c was 6 in August 2020, recheck next year during physical.  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  -Lab work today; will notify you once results are available.  -Tale allegra daily and mucinex twice  daily.       Chaya Jan, MD [facility] at [facility]

## 2019-11-06 ENCOUNTER — Other Ambulatory Visit: Payer: Self-pay | Admitting: Internal Medicine

## 2019-11-06 DIAGNOSIS — E559 Vitamin D deficiency, unspecified: Secondary | ICD-10-CM

## 2019-11-06 LAB — VITAMIN D 25 HYDROXY (VIT D DEFICIENCY, FRACTURES): VITD: 29.75 ng/mL — ABNORMAL LOW (ref 30.00–100.00)

## 2019-11-06 MED ORDER — VITAMIN D (ERGOCALCIFEROL) 1.25 MG (50000 UNIT) PO CAPS: 50000.0000 [IU] | ORAL_CAPSULE | ORAL | 0 refills | Status: DC

## 2019-11-09 ENCOUNTER — Ambulatory Visit: Payer: 59 | Attending: Internal Medicine

## 2019-11-09 DIAGNOSIS — Z23 Encounter for immunization: Secondary | ICD-10-CM

## 2019-11-09 NOTE — Progress Notes (Signed)
   Covid-19 Vaccination Clinic  Name:  Caleb Arias    MRN: [id number] DOB: October 08, 1975  11/09/2019  [name] was observed post Covid-19 immunization for 15 minutes without incident. He was provided with Vaccine Information Sheet and instruction to access the V-Safe system.   [name] was instructed to call 911 with any severe reactions post vaccine: Marland Kitchen Difficulty breathing  . Swelling of face and throat  . A fast heartbeat  . A bad rash all over body  . Dizziness and weakness   Immunizations Administered    Name Date Dose VIS Date Route   Pfizer COVID-19 Vaccine 11/09/2019  2:39 PM 0.3 mL 07/10/2019 Intramuscular   Manufacturer: [employer], [employer]   Lot: [id number]   NDC: [address]-7

## 2019-11-10 ENCOUNTER — Other Ambulatory Visit: Payer: Self-pay | Admitting: Internal Medicine

## 2019-11-10 DIAGNOSIS — E559 Vitamin D deficiency, unspecified: Secondary | ICD-10-CM

## 2019-11-30 ENCOUNTER — Other Ambulatory Visit: Payer: Self-pay | Admitting: Internal Medicine

## 2019-11-30 DIAGNOSIS — F411 Generalized anxiety disorder: Secondary | ICD-10-CM

## 2019-12-10 ENCOUNTER — Ambulatory Visit: Payer: 59 | Admitting: Neurology

## 2020-01-07 ENCOUNTER — Telehealth: Payer: Self-pay | Admitting: Internal Medicine

## 2020-01-07 DIAGNOSIS — I2699 Other pulmonary embolism without acute cor pulmonale: Secondary | ICD-10-CM

## 2020-01-07 MED ORDER — ELIQUIS 5 MG PO TABS: 5.0000 mg | ORAL_TABLET | Freq: Two times a day (BID) | ORAL | 0 refills | Status: DC

## 2020-01-07 NOTE — Telephone Encounter (Addendum)
Pt states he was prescribed eliquis and is leaving for [place] but forgot to get the medication from his pharmacy. He is wondering if it can be sent to a pharmacy that he will be near in [place], he only needs a few days worth.   [facility]: [facility] [address] [place], [place] [address] FAX: [phone]  Informed pt that we asked to allow 2-3 business days for response. Pt stated he is not suppose to miss a dose and if it can be called in by another PCP. Informed pt that I cannot guarantee this will be sent in today or that another PCP will be able to approve.    Pt would like a call at [phone]

## 2020-01-07 NOTE — Telephone Encounter (Signed)
I left a detailed message at the pts cell number the refill was approved by [facility] and [facility] in [place].

## 2020-01-07 NOTE — Telephone Encounter (Signed)
Ok to send in 10 days of his Eliquis dose to the pharmacy in [place]

## 2020-01-19 ENCOUNTER — Other Ambulatory Visit: Payer: Self-pay | Admitting: Internal Medicine

## 2020-01-19 DIAGNOSIS — E559 Vitamin D deficiency, unspecified: Secondary | ICD-10-CM

## 2020-01-20 ENCOUNTER — Ambulatory Visit: Payer: 59 | Admitting: Neurology

## 2020-01-25 ENCOUNTER — Ambulatory Visit: Payer: 59 | Admitting: Neurology

## 2020-02-04 ENCOUNTER — Ambulatory Visit: Payer: 59 | Admitting: Internal Medicine

## 2020-02-04 DIAGNOSIS — Z0289 Encounter for other administrative examinations: Secondary | ICD-10-CM

## 2020-02-19 ENCOUNTER — Other Ambulatory Visit: Payer: Self-pay | Admitting: *Deleted

## 2020-02-19 DIAGNOSIS — K219 Gastro-esophageal reflux disease without esophagitis: Secondary | ICD-10-CM

## 2020-02-19 NOTE — Telephone Encounter (Signed)
Prior Auth started for pantoprazole (PROTONIX) 40 MG tablet  Key [id number]

## 2020-02-23 ENCOUNTER — Other Ambulatory Visit: Payer: Self-pay | Admitting: Internal Medicine

## 2020-02-23 DIAGNOSIS — I2699 Other pulmonary embolism without acute cor pulmonale: Secondary | ICD-10-CM

## 2020-03-02 ENCOUNTER — Ambulatory Visit: Payer: 59 | Admitting: Internal Medicine

## 2020-03-02 MED ORDER — PANTOPRAZOLE SODIUM 40 MG PO TBEC: 40.0000 mg | DELAYED_RELEASE_TABLET | Freq: Every day | ORAL | 5 refills | Status: DC

## 2020-03-02 NOTE — Telephone Encounter (Signed)
Your PA request has been closed. PANTOPRAZOLE SODIUM 40MG  DR TAB #30/30 days // //No PA is required, was rejecting due to the plan only allows a 30 day supply at retail - SB, Tech III Prior Auth and Appeals 02/20/2020 10:48 AM  New refill sent

## 2020-03-08 ENCOUNTER — Ambulatory Visit: Payer: 59 | Admitting: Internal Medicine

## 2020-03-08 ENCOUNTER — Telehealth: Payer: Self-pay | Admitting: Internal Medicine

## 2020-03-08 NOTE — Telephone Encounter (Signed)
Pt switch insurance recently from [place] to [place] and the medication for Protonix is requiring a PA. Informed pt of the process of having to go through insurance for approval for PA and could take a few business days depending on how fast insurance approves or denies it.   Pt can be reached at [phone]

## 2020-03-09 NOTE — Telephone Encounter (Signed)
Proir Auth started Key: [employer]

## 2020-03-15 ENCOUNTER — Other Ambulatory Visit: Payer: Self-pay

## 2020-03-15 ENCOUNTER — Ambulatory Visit: Payer: BC Managed Care – PPO | Admitting: Internal Medicine

## 2020-03-15 ENCOUNTER — Encounter: Payer: Self-pay | Admitting: Internal Medicine

## 2020-03-15 ENCOUNTER — Ambulatory Visit: Payer: 59 | Admitting: Internal Medicine

## 2020-03-15 VITALS — BP 120/88 | HR 75 | Temp 97.6°F | Wt 251.8 lb

## 2020-03-15 DIAGNOSIS — R7302 Impaired glucose tolerance (oral): Secondary | ICD-10-CM

## 2020-03-15 DIAGNOSIS — I2782 Chronic pulmonary embolism: Secondary | ICD-10-CM

## 2020-03-15 DIAGNOSIS — E66811 Obesity, class 1: Secondary | ICD-10-CM

## 2020-03-15 DIAGNOSIS — B3749 Other urogenital candidiasis: Secondary | ICD-10-CM | POA: Diagnosis not present

## 2020-03-15 DIAGNOSIS — I2609 Other pulmonary embolism with acute cor pulmonale: Secondary | ICD-10-CM

## 2020-03-15 DIAGNOSIS — F411 Generalized anxiety disorder: Secondary | ICD-10-CM

## 2020-03-15 DIAGNOSIS — E785 Hyperlipidemia, unspecified: Secondary | ICD-10-CM

## 2020-03-15 DIAGNOSIS — E669 Obesity, unspecified: Secondary | ICD-10-CM

## 2020-03-15 DIAGNOSIS — E559 Vitamin D deficiency, unspecified: Secondary | ICD-10-CM

## 2020-03-15 DIAGNOSIS — K219 Gastro-esophageal reflux disease without esophagitis: Secondary | ICD-10-CM

## 2020-03-15 LAB — POCT GLYCOSYLATED HEMOGLOBIN (HGB A1C): Hemoglobin A1C: 5.6 % (ref 4.0–5.6)

## 2020-03-15 MED ORDER — OMEPRAZOLE 40 MG PO CPDR: 40.0000 mg | DELAYED_RELEASE_CAPSULE | Freq: Every day | ORAL | 1 refills | Status: AC

## 2020-03-15 MED ORDER — FEXOFENADINE HCL 180 MG PO TABS: 180.0000 mg | ORAL_TABLET | Freq: Every day | ORAL | 0 refills | Status: AC

## 2020-03-15 MED ORDER — TERBINAFINE 1 % EX GEL: CUTANEOUS | 1 refills | Status: DC

## 2020-03-15 NOTE — Patient Instructions (Signed)
-  Nice seeing you today!!  -Apply terbinafine cream twice daily to clean, dry skin.  -Start omeprazole 40 mg daily.  -Stop taking Eliquis once you finish your last prescription.  -Schedule follow up in 3 months for your physical. Please come in fasting that day.

## 2020-03-15 NOTE — Telephone Encounter (Signed)
Prior Auth was denied.  New Rx sent to pharmacy at office visit 03/15/20.

## 2020-03-15 NOTE — Progress Notes (Signed)
Established Patient Office Visit     This visit occurred during the SARS-CoV-2 public health emergency.  Safety protocols were in place, including screening questions prior to the visit, additional usage of staff PPE, and extensive cleaning of exam room while observing appropriate contact time as indicated for disinfecting solutions.    CC/Reason for Visit: Follow-up chronic conditions and discuss some acute concerns  HPI: Caleb Arias is a 44 y.o. male who is coming in today for the above mentioned reasons. Past Medical History is significant for: Episode of pulmonary embolism in August 2020, generalized anxiety disorder, impaired glucose tolerance with an A1c of 6.0 in August 2020, history of vitamin D deficiency, GERD and hyperlipidemia.  His insurance is now requiring prior authorization for his pantoprazole and is recommending he be switched over to omeprazole.  He would like me to look at a white discharge around his penile head, no pain or dysuria.   Past Medical/Surgical History: Past Medical History:  Diagnosis Date  . Pulmonary emboli (HCC)   . Tobacco abuse     No past surgical history on file.  Social History:  reports that he has been smoking cigarettes. He has been smoking about 1.50 packs per day. He has never used smokeless tobacco. He reports current alcohol use. He reports that he does not use drugs.  Allergies: Allergies  Allergen Reactions  . Asa [Aspirin] Other (See Comments)    Not to take while on Eliquis    Family History:  No history of heart disease, cancer, stroke that he is aware of  Current Outpatient Medications:  .  acetaminophen (TYLENOL) 500 MG tablet, Take 500-1,000 mg by mouth every 6 (six) hours as needed for mild pain or headache., Disp: , Rfl:  .  ELIQUIS 5 MG TABS tablet, TAKE 1 TABLET BY MOUTH TWICE A DAY, Disp: 60 tablet, Rfl: 5 .  fexofenadine (ALLEGRA) 180 MG tablet, Take 1 tablet (180 mg total) by mouth daily., Disp: 90  tablet, Rfl: 0 .  sertraline (ZOLOFT) 100 MG tablet, TAKE 1 TABLET BY MOUTH EVERY DAY, Disp: 90 tablet, Rfl: 1 .  Vitamin D, Ergocalciferol, (DRISDOL) 1.25 MG (50000 UNIT) CAPS capsule, TAKE 1 CAPSULE (50,000 UNITS TOTAL) BY MOUTH EVERY 7 (SEVEN) DAYS FOR 12 DOSES., Disp: 4 capsule, Rfl: 2 .  omeprazole (PRILOSEC) 40 MG capsule, Take 1 capsule (40 mg total) by mouth daily., Disp: 90 capsule, Rfl: 1 .  Terbinafine 1 % GEL, Apply twice daily to clean, dry skin, Disp: 12 g, Rfl: 1  Review of Systems:  Constitutional: Denies fever, chills, diaphoresis, appetite change and fatigue.  HEENT: Denies photophobia, eye pain, redness, hearing loss, ear pain, congestion, sore throat, rhinorrhea, sneezing, mouth sores, trouble swallowing, neck pain, neck stiffness and tinnitus.   Respiratory: Denies SOB, DOE, cough, chest tightness,  and wheezing.   Cardiovascular: Denies chest pain, palpitations and leg swelling.  Gastrointestinal: Denies nausea, vomiting, abdominal pain, diarrhea, constipation, blood in stool and abdominal distention.  Genitourinary: Denies dysuria, urgency, frequency, hematuria, flank pain and difficulty urinating.  Endocrine: Denies: hot or cold intolerance, sweats, changes in hair or nails, polyuria, polydipsia. Musculoskeletal: Denies myalgias, back pain, joint swelling, arthralgias and gait problem.  Skin: Denies pallor, rash and wound.  Neurological: Denies dizziness, seizures, syncope, weakness, light-headedness, numbness and headaches.  Hematological: Denies adenopathy. Easy bruising, personal or family bleeding history  Psychiatric/Behavioral: Denies suicidal ideation, mood changes, confusion, nervousness, sleep disturbance and agitation    Physical Exam: Vitals:  03/15/20 1502  BP: 120/88  Pulse: 75  Temp: 97.6 F (36.4 C)  TempSrc: Oral  SpO2: 96%  Weight: 251 lb 12.8 oz (114.2 kg)    Body mass index is 35.12 kg/m.   Constitutional: NAD, calm, comfortable Eyes:  PERRL, lids and conjunctivae normal ENMT: Mucous membranes are moist.  Respiratory: clear to auscultation bilaterally, no wheezing, no crackles. Normal respiratory effort. No accessory muscle use.  Cardiovascular: Regular rate and rhythm, no murmurs / rubs / gallops. No extremity edema. 2+ pedal pulses. No carotid bruits.  Neurologic: Grossly intact and nonfocal Psychiatric: Normal judgment and insight. Alert and oriented x 3. Normal mood.  Genitourinary: White discharge around the head of the penis   Impression and Plan:  IGT (impaired glucose tolerance)  -A1c is 5.6 today, no medications, advised continued lifestyle modifications.  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  Hyperlipidemia, unspecified hyperlipidemia type -Last LDL was 96 in August 2020, recheck when he returns for physical.  GAD (generalized anxiety disorder) -Well-controlled on Zoloft.  Obesity (BMI 30.0-34.9) -Discussed healthy lifestyle, including increased physical activity and better food choices to promote weight loss.  Vitamin D deficiency -Recheck when he returns for physical.  Gastroesophageal reflux disease without esophagitis  - Plan: omeprazole (PRILOSEC) 40 MG capsule    Patient Instructions  -Nice seeing you today!!  -Apply terbinafine cream twice daily to clean, dry skin.  -Start omeprazole 40 mg daily.  -Stop taking Eliquis once you finish your last prescription.  -Schedule follow up in 3 months for your physical. Please come in fasting that day.     Chaya Jan, MD [facility]

## 2020-03-16 ENCOUNTER — Other Ambulatory Visit: Payer: Self-pay | Admitting: Internal Medicine

## 2020-03-16 DIAGNOSIS — B3749 Other urogenital candidiasis: Secondary | ICD-10-CM

## 2020-03-17 ENCOUNTER — Ambulatory Visit: Payer: 59 | Admitting: Neurology

## 2020-03-18 ENCOUNTER — Telehealth: Payer: Self-pay | Admitting: Internal Medicine

## 2020-03-25 DIAGNOSIS — Z20822 Contact with and (suspected) exposure to covid-19: Secondary | ICD-10-CM | POA: Diagnosis not present

## 2020-04-05 ENCOUNTER — Telehealth: Payer: Self-pay | Admitting: *Deleted

## 2020-04-05 ENCOUNTER — Encounter: Payer: Self-pay | Admitting: Neurology

## 2020-04-05 ENCOUNTER — Ambulatory Visit: Payer: 59 | Admitting: Neurology

## 2020-04-05 NOTE — Telephone Encounter (Signed)
No showed new patient appointment. 

## 2020-04-07 ENCOUNTER — Other Ambulatory Visit: Payer: Self-pay | Admitting: Internal Medicine

## 2020-04-07 DIAGNOSIS — E559 Vitamin D deficiency, unspecified: Secondary | ICD-10-CM

## 2020-06-10 ENCOUNTER — Other Ambulatory Visit: Payer: Self-pay | Admitting: Internal Medicine

## 2020-06-10 DIAGNOSIS — F411 Generalized anxiety disorder: Secondary | ICD-10-CM

## 2020-07-01 ENCOUNTER — Encounter: Payer: BC Managed Care – PPO | Admitting: Internal Medicine

## 2020-07-17 DIAGNOSIS — Z20822 Contact with and (suspected) exposure to covid-19: Secondary | ICD-10-CM | POA: Diagnosis not present

## 2020-08-07 DIAGNOSIS — Z1152 Encounter for screening for COVID-19: Secondary | ICD-10-CM | POA: Diagnosis not present

## 2020-09-10 DIAGNOSIS — J320 Chronic maxillary sinusitis: Secondary | ICD-10-CM | POA: Diagnosis not present

## 2020-09-10 DIAGNOSIS — J309 Allergic rhinitis, unspecified: Secondary | ICD-10-CM | POA: Diagnosis not present

## 2020-09-17 DIAGNOSIS — Z86711 Personal history of pulmonary embolism: Secondary | ICD-10-CM | POA: Diagnosis not present

## 2020-09-17 DIAGNOSIS — I4510 Unspecified right bundle-branch block: Secondary | ICD-10-CM | POA: Diagnosis not present

## 2020-09-17 DIAGNOSIS — Z79899 Other long term (current) drug therapy: Secondary | ICD-10-CM | POA: Diagnosis not present

## 2020-09-17 DIAGNOSIS — R42 Dizziness and giddiness: Secondary | ICD-10-CM | POA: Diagnosis not present

## 2020-09-17 DIAGNOSIS — Z8616 Personal history of COVID-19: Secondary | ICD-10-CM | POA: Diagnosis not present

## 2020-09-27 ENCOUNTER — Encounter: Payer: BC Managed Care – PPO | Admitting: Internal Medicine

## 2020-10-12 IMAGING — CT CT ANGIO CHEST
2 of 7 series · 18 of 46 positions shown · IV contrast (APPLIED)
Comparison: Chest radiograph dated [DATE]

CLINICAL DATA: 43-year-old male with shortness of breath and chest
discomfort.

EXAM:
CT ANGIOGRAPHY CHEST WITH CONTRAST
TECHNIQUE: Multidetector CT imaging of the chest was performed using the
standard protocol during bolus administration of intravenous
contrast. Multiplanar CT image reconstructions and MIPs were
obtained to evaluate the vascular anatomy.
CONTRAST:  75mL OMNIPAQUE IOHEXOL 350 MG/ML SOLN

[Series 7: thins · axial · 0.76mm/px · z∈[-235,+11]mm · 15 of 398 slices shown]
[im 23/398  lung]
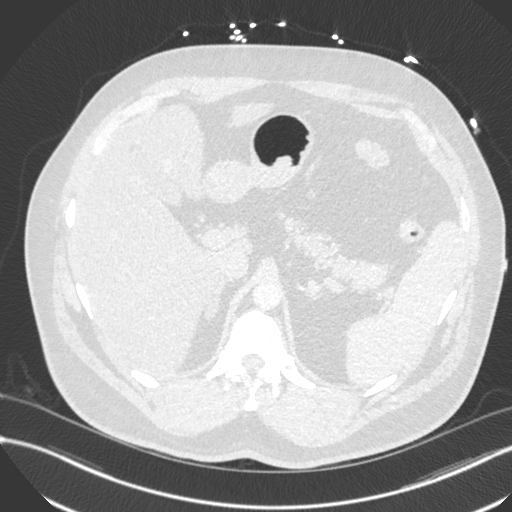
[im 45/398  soft-tissue]
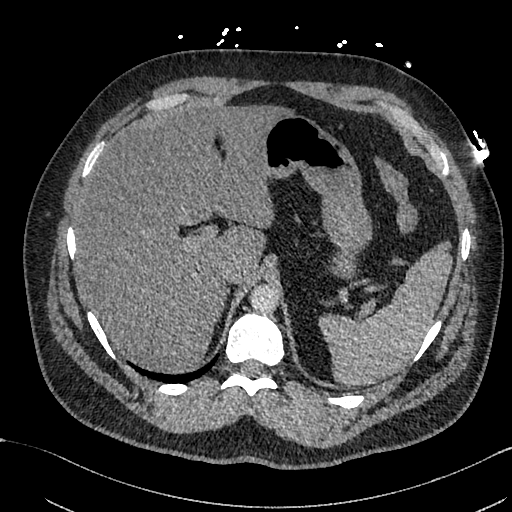
[im 67/398  lung]
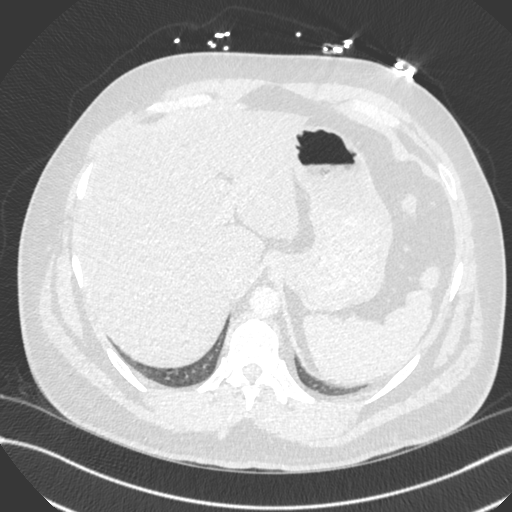
[im 89/398  soft-tissue]
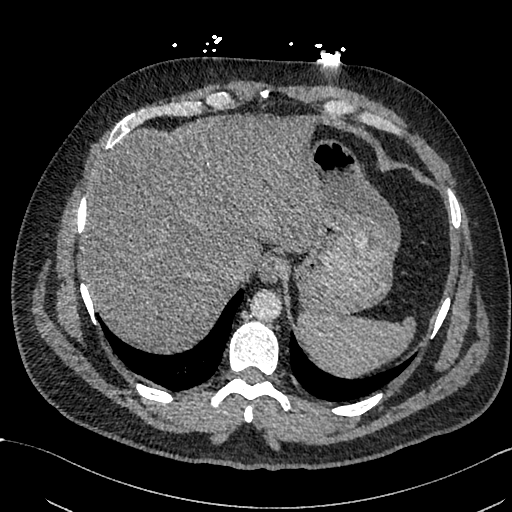
[im 133/398  lung]
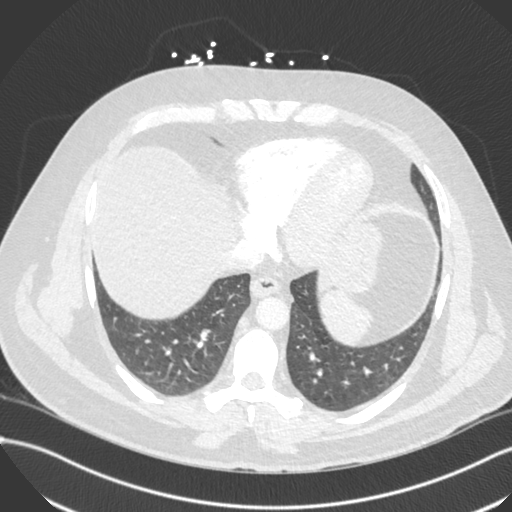
[im 155/398  soft-tissue]
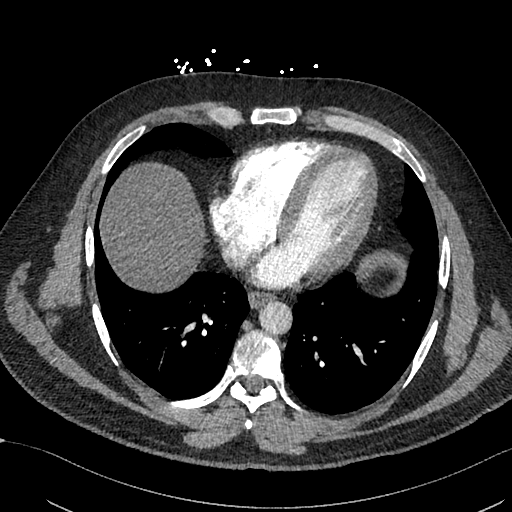
[im 177/398  lung]
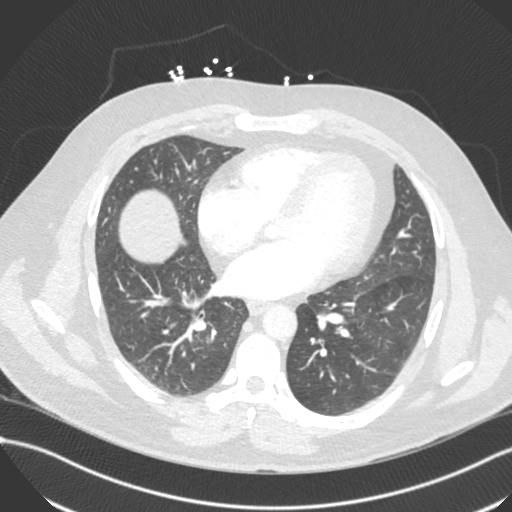
[im 199/398  soft-tissue]
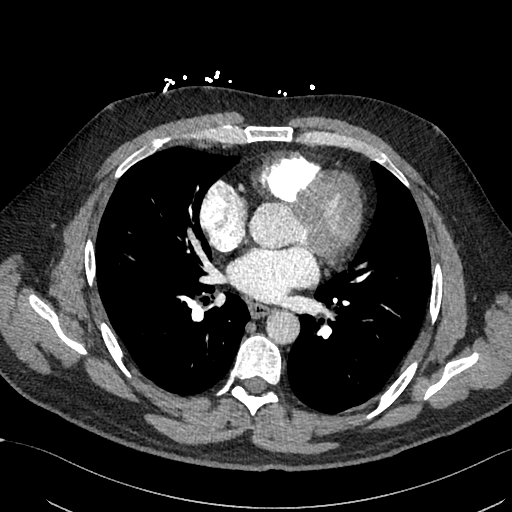
[im 221/398  lung]
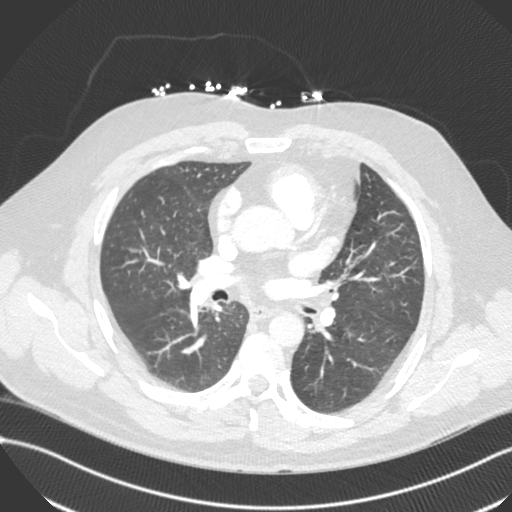
[im 243/398  soft-tissue]
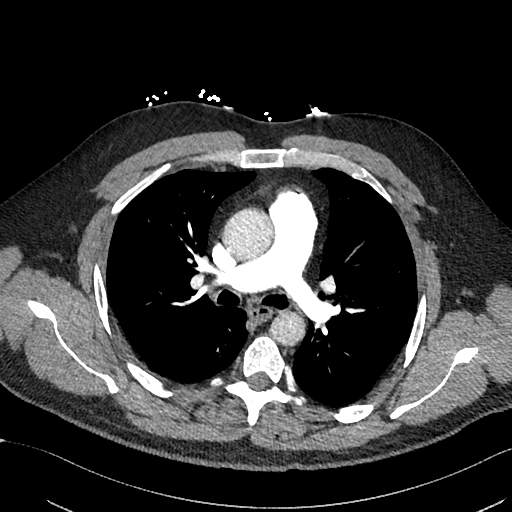
[im 265/398  lung]
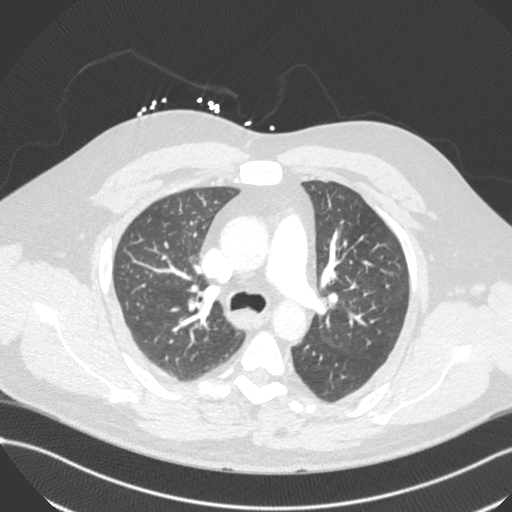
[im 309/398  soft-tissue]
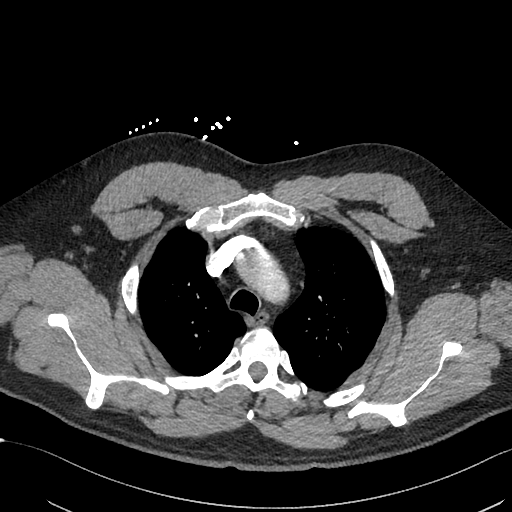
[im 331/398  lung]
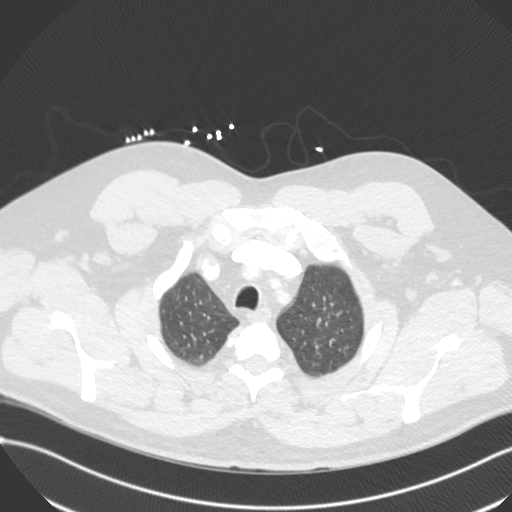
[im 353/398  soft-tissue]
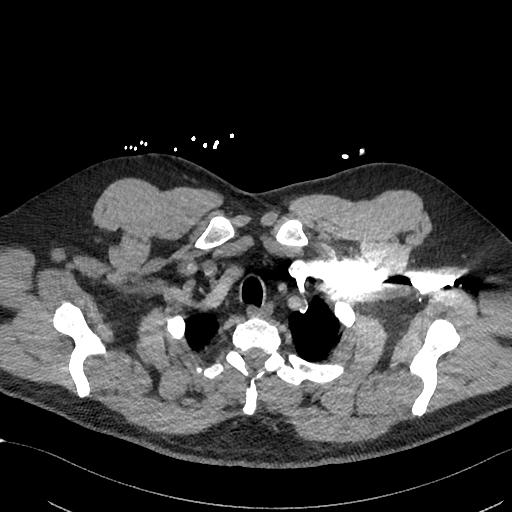
[im 375/398  lung]
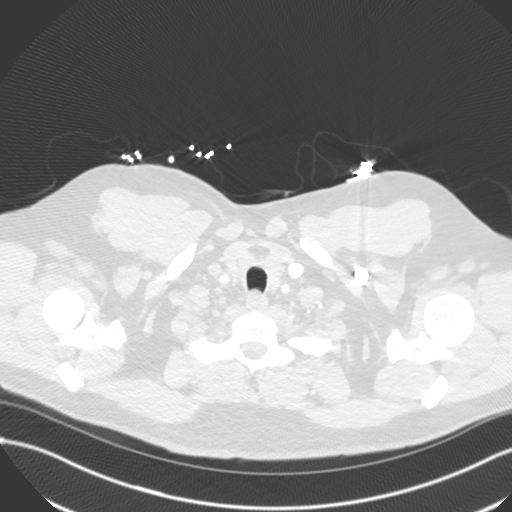

[Series 8: cor · coronal · 0.55mm/px · 3 of 174 slices shown]
[im 44/174  soft-tissue]
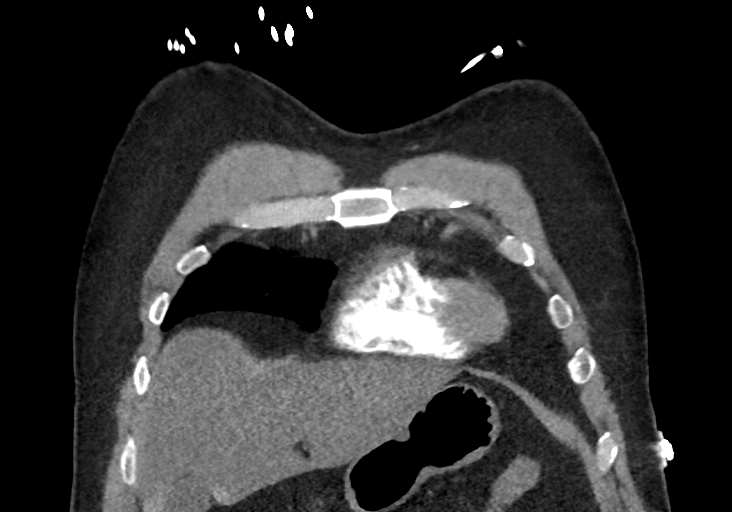
[im 87/174  soft-tissue]
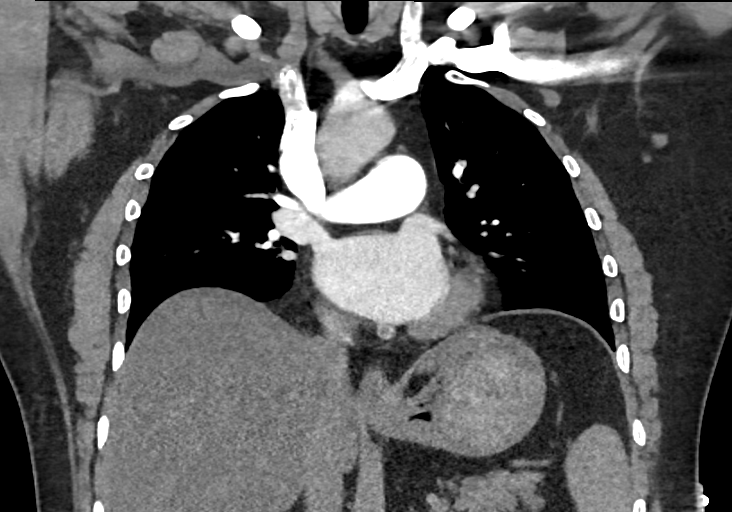
[im 130/174  soft-tissue]
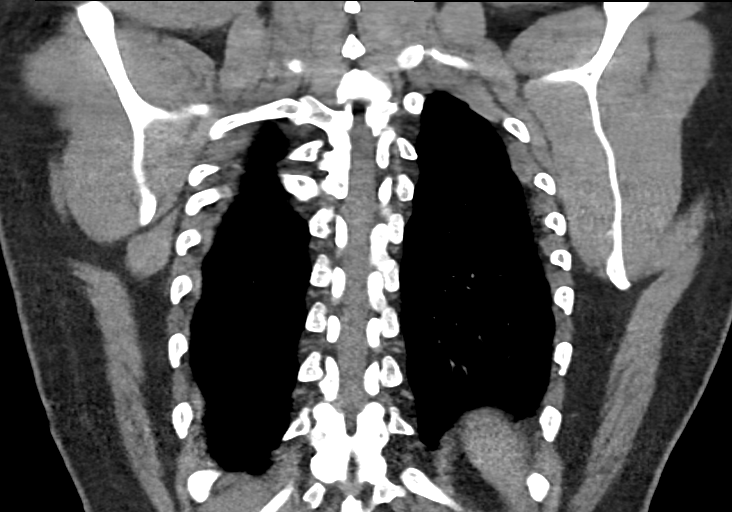

[18 of 46 positions shown; findings below may reference images not displayed]

FINDINGS: Cardiovascular: There is no cardiomegaly or pericardial effusion.
The thoracic aorta is unremarkable. There is no CT evidence of
pulmonary embolism.

Mediastinum/Nodes: No hilar or mediastinal adenopathy. The esophagus
and the thyroid gland are grossly unremarkable. No mediastinal fluid
collection.

Lungs/Pleura: No focal consolidation, pleural effusion,
pneumothorax. The central airways are patent.

Upper Abdomen: Diffuse fatty infiltration of the liver. The
visualized upper abdomen is otherwise unremarkable.

Musculoskeletal: No chest wall abnormality. No acute or significant
osseous findings.

Review of the MIP images confirms the above findings.
IMPRESSION: No acute intrathoracic pathology. No CT evidence of pulmonary
embolism.

## 2020-10-17 ENCOUNTER — Emergency Department (HOSPITAL_COMMUNITY)
Admission: EM | Admit: 2020-10-17 | Discharge: 2020-10-18 | Disposition: A | Discharge status: Home / Self Care | Payer: BC Managed Care – PPO | Attending: Emergency Medicine | Admitting: Emergency Medicine

## 2020-10-17 ENCOUNTER — Emergency Department (HOSPITAL_COMMUNITY): Payer: BC Managed Care – PPO

## 2020-10-17 DIAGNOSIS — Z5321 Procedure and treatment not carried out due to patient leaving prior to being seen by health care provider: Secondary | ICD-10-CM | POA: Insufficient documentation

## 2020-10-17 DIAGNOSIS — R079 Chest pain, unspecified: Secondary | ICD-10-CM | POA: Insufficient documentation

## 2020-10-17 DIAGNOSIS — Z7901 Long term (current) use of anticoagulants: Secondary | ICD-10-CM | POA: Insufficient documentation

## 2020-10-17 DIAGNOSIS — F17210 Nicotine dependence, cigarettes, uncomplicated: Secondary | ICD-10-CM | POA: Insufficient documentation

## 2020-10-17 LAB — BASIC METABOLIC PANEL
Anion gap: 7 (ref 5–15)
BUN: 14 mg/dL (ref 6–20)
CO2: 24 mmol/L (ref 22–32)
Calcium: 8.8 mg/dL — ABNORMAL LOW (ref 8.9–10.3)
Chloride: 107 mmol/L (ref 98–111)
Creatinine, Ser: 0.89 mg/dL (ref 0.61–1.24)
GFR, Estimated: >60 mL/min (ref >60)
Glucose, Bld: 111 mg/dL — ABNORMAL HIGH (ref 70–99)
Potassium: 3.5 mmol/L (ref 3.5–5.1)
Sodium: 138 mmol/L (ref 135–145)

## 2020-10-17 LAB — CBC
HCT: 45.6 % (ref 39.0–52.0)
Hemoglobin: 15.4 g/dL (ref 13.0–17.0)
MCH: 29.7 pg (ref 26.0–34.0)
MCHC: 33.8 g/dL (ref 30.0–36.0)
MCV: 88 fL (ref 80.0–100.0)
Platelets: 195 10*3/uL (ref 150–400)
RBC: 5.18 MIL/uL (ref 4.22–5.81)
RDW: 12.6 % (ref 11.5–15.5)
WBC: 7 10*3/uL (ref 4.0–10.5)
nRBC: 0 % (ref 0.0–0.2)

## 2020-10-17 LAB — TROPONIN I (HIGH SENSITIVITY)
Troponin I (High Sensitivity): 3 ng/L (ref <18)
Troponin I (High Sensitivity): 3 ng/L (ref <18)

## 2020-10-17 LAB — PROTIME-INR
INR: 1 (ref 0.8–1.2)
Prothrombin Time: 12.4 seconds (ref 11.4–15.2)

## 2020-10-17 NOTE — ED Triage Notes (Signed)
Pt reports went to urgent care for intermittent chest pain for 2 weeks. Urgent care recommend pt come here due to hx of PE Aug 2020. Pt states he was on eliquis for a year and now no longer taking. Pt states about a week ago chest pain was bad and called EMS, where they did an EKG and said it was normal. Pt rates chest pain an 2. Pt denies SOB/N/V/D

## 2020-10-18 ENCOUNTER — Other Ambulatory Visit: Payer: Self-pay

## 2020-10-18 ENCOUNTER — Emergency Department (HOSPITAL_BASED_OUTPATIENT_CLINIC_OR_DEPARTMENT_OTHER)
Admission: EM | Admit: 2020-10-18 | Discharge: 2020-10-18 | Disposition: A | Discharge status: Home / Self Care | Payer: BC Managed Care – PPO | Source: Home / Self Care | Attending: Emergency Medicine | Admitting: Emergency Medicine

## 2020-10-18 ENCOUNTER — Encounter (HOSPITAL_BASED_OUTPATIENT_CLINIC_OR_DEPARTMENT_OTHER): Payer: Self-pay

## 2020-10-18 ENCOUNTER — Emergency Department (HOSPITAL_BASED_OUTPATIENT_CLINIC_OR_DEPARTMENT_OTHER): Payer: BC Managed Care – PPO

## 2020-10-18 ENCOUNTER — Emergency Department (HOSPITAL_COMMUNITY)
Admission: EM | Admit: 2020-10-18 | Discharge: 2020-10-18 | Discharge status: Against Medical Advice | Payer: BC Managed Care – PPO

## 2020-10-18 DIAGNOSIS — Z7901 Long term (current) use of anticoagulants: Secondary | ICD-10-CM | POA: Insufficient documentation

## 2020-10-18 DIAGNOSIS — R0789 Other chest pain: Secondary | ICD-10-CM

## 2020-10-18 DIAGNOSIS — F17210 Nicotine dependence, cigarettes, uncomplicated: Secondary | ICD-10-CM | POA: Insufficient documentation

## 2020-10-18 DIAGNOSIS — Z720 Tobacco use: Secondary | ICD-10-CM

## 2020-10-18 LAB — HEPATIC FUNCTION PANEL
ALT: 56 U/L — ABNORMAL HIGH (ref 0–44)
AST: 29 U/L (ref 15–41)
Albumin: 4.2 g/dL (ref 3.5–5.0)
Alkaline Phosphatase: 81 U/L (ref 38–126)
Bilirubin, Direct: <0.1 mg/dL (ref 0.0–0.2)
Total Bilirubin: 0.4 mg/dL (ref 0.3–1.2)
Total Protein: 6.3 g/dL — ABNORMAL LOW (ref 6.5–8.1)

## 2020-10-18 LAB — CBC
HCT: 43.8 % (ref 39.0–52.0)
Hemoglobin: 15 g/dL (ref 13.0–17.0)
MCH: 29.8 pg (ref 26.0–34.0)
MCHC: 34.2 g/dL (ref 30.0–36.0)
MCV: 87.1 fL (ref 80.0–100.0)
Platelets: 178 10*3/uL (ref 150–400)
RBC: 5.03 MIL/uL (ref 4.22–5.81)
RDW: 12.7 % (ref 11.5–15.5)
WBC: 5.7 10*3/uL (ref 4.0–10.5)
nRBC: 0 % (ref 0.0–0.2)

## 2020-10-18 LAB — BASIC METABOLIC PANEL
Anion gap: 9 (ref 5–15)
BUN: 15 mg/dL (ref 6–20)
CO2: 25 mmol/L (ref 22–32)
Calcium: 8.6 mg/dL — ABNORMAL LOW (ref 8.9–10.3)
Chloride: 107 mmol/L (ref 98–111)
Creatinine, Ser: 0.91 mg/dL (ref 0.61–1.24)
GFR, Estimated: >60 mL/min (ref >60)
Glucose, Bld: 142 mg/dL — ABNORMAL HIGH (ref 70–99)
Potassium: 3.8 mmol/L (ref 3.5–5.1)
Sodium: 141 mmol/L (ref 135–145)

## 2020-10-18 LAB — TROPONIN I (HIGH SENSITIVITY): Troponin I (High Sensitivity): <2 ng/L (ref <18)

## 2020-10-18 MED ORDER — IOHEXOL 350 MG/ML SOLN
100.0000 mL | Freq: Once | INTRAVENOUS | Status: AC | PRN
  Administered 2020-10-18: 100 mL via INTRAVENOUS

## 2020-10-18 NOTE — ED Triage Notes (Signed)
CP x 2 weeks with hx of PE in 02/2019.  No longer on Eliquis.  Pt also c/o being lightheaded intermittently.  Denies SOB/cough, here from [facility] ED d/t wait.

## 2020-10-18 NOTE — ED Notes (Signed)
Stated he was here last night and waited 10 hours and he'd go somewhere else due to similar wait times.

## 2020-10-18 NOTE — ED Provider Notes (Signed)
[facility] EMERGENCY DEPT Provider Note   CSN: [id number] Arrival date & time: 10/18/20  1520     History No chief complaint on file.   Caleb Arias is a 45 y.o. male.  Pt presents to the ED today with CP.  Pt said it has been intermittent for about 2 weeks.  He does have a hx of PE in August of 2020.  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  Pt said he was on Eliquis for 1 year, but has been off it for several months.  Pt said he has been working a lot lately.  He does smoke about 1ppd and has not been exercising.  No hx of CAD.        Past Medical History:  Diagnosis Date  . Pulmonary emboli (HCC)   . Tobacco abuse     Patient Active Problem List   Diagnosis Date Noted  . GAD (generalized anxiety disorder) 11/05/2019  . Pulmonary embolism (HCC) 05/01/2019  . Atypical chest pain 05/01/2019  . IGT (impaired glucose tolerance) 04/22/2019  . Hyperlipidemia 04/22/2019  . Obesity (BMI 30.0-34.9) 03/11/2019  . Vitamin D deficiency 03/11/2019  . Tobacco abuse     History reviewed. No pertinent surgical history.     History reviewed. No pertinent family history.  Social History   Tobacco Use  . Smoking status: Current Every Day Smoker    Packs/day: 1.50    Types: Cigarettes  . Smokeless tobacco: Never Used  Substance Use Topics  . Alcohol use: Yes  . Drug use: Never    Home Medications Prior to Admission medications   Medication Sig Start Date End Date Taking? Authorizing Provider  acetaminophen (TYLENOL) 500 MG tablet Take 500-1,000 mg by mouth every 6 (six) hours as needed for mild pain or headache.   Yes [provider]  ELIQUIS 5 MG TABS tablet TAKE 1 TABLET BY MOUTH TWICE A DAY 02/23/20   Philip Aspen, Limmie Patricia, MD  fexofenadine (ALLEGRA) 180 MG tablet Take 1 tablet (180 mg total) by mouth daily. 03/15/20 10/03/20  Philip Aspen, Limmie Patricia, MD  omeprazole (PRILOSEC) 40 MG capsule  Take 1 capsule (40 mg total) by mouth daily. 03/15/20   Philip Aspen, Limmie Patricia, MD  sertraline (ZOLOFT) 100 MG tablet TAKE 1 TABLET BY MOUTH EVERY DAY 06/13/20   Philip Aspen, Limmie Patricia, MD  terbinafine (LAMISIL) 1 % cream Apply topically 2 (two) times daily. 03/16/20   Philip Aspen, Limmie Patricia, MD    Allergies    Asa [aspirin]  Review of Systems   Review of Systems  Cardiovascular: Positive for chest pain.  All other systems reviewed and are negative.   Physical Exam Updated Vital Signs BP 135/86 (BP Location: Right Arm)   Pulse 88   Temp 98.4 F (36.9 C) (Oral)   Resp (!) 22   Ht 5\' 11"  (1.803 m)   Wt 117.9 kg   SpO2 98%   BMI 36.26 kg/m   Physical Exam Vitals and nursing note reviewed.  Constitutional:      Appearance: Normal appearance.  HENT:     Head: Normocephalic and atraumatic.     Right Ear: External ear normal.     Left Ear: External ear normal.     Nose: Nose normal.     Mouth/Throat:     Mouth: Mucous membranes are moist.     Pharynx: Oropharynx is clear.  Eyes:  Extraocular Movements: Extraocular movements intact.     Conjunctiva/sclera: Conjunctivae normal.     Pupils: Pupils are equal, round, and reactive to light.  Cardiovascular:     Rate and Rhythm: Normal rate and regular rhythm.     Pulses: Normal pulses.     Heart sounds: Normal heart sounds.  Pulmonary:     Effort: Pulmonary effort is normal.     Breath sounds: Normal breath sounds.  Abdominal:     General: Abdomen is flat. Bowel sounds are normal.     Palpations: Abdomen is soft.  Musculoskeletal:        General: Normal range of motion.     Cervical back: Normal range of motion and neck supple.  Skin:    General: Skin is warm.     Capillary Refill: Capillary refill takes less than 2 seconds.  Neurological:     General: No focal deficit present.     Mental Status: He is alert and oriented to person, place, and time.     ED Results / Procedures / Treatments   Labs (all  labs ordered are listed, but only abnormal results are displayed) Labs Reviewed  BASIC METABOLIC PANEL - Abnormal; Notable for the following components:      Result Value   Glucose, Bld 142 (*)    Calcium 8.6 (*)    All other components within normal limits  HEPATIC FUNCTION PANEL - Abnormal; Notable for the following components:   Total Protein 6.3 (*)    ALT 56 (*)    All other components within normal limits  CBC  TROPONIN I (HIGH SENSITIVITY)    EKG None  Radiology DG Chest 2 View  Result Date: 10/17/2020 CLINICAL DATA:  45 year old male with chest pain. EXAM: CHEST - 2 VIEW COMPARISON:  Chest radiograph dated 07/20/2019. FINDINGS: The heart size and mediastinal contours are within normal limits. Both lungs are clear. The visualized skeletal structures are unremarkable. IMPRESSION: No active cardiopulmonary disease. Electronically Signed   By: Elgie Collard M.D.   On: 10/17/2020 19:58   CT Angio Chest PE W/Cm &/Or Wo Cm  Result Date: 10/18/2020 CLINICAL DATA:  Chest pain 2 weeks.  History of PE. EXAM: CT ANGIOGRAPHY CHEST WITH CONTRAST TECHNIQUE: Multidetector CT imaging of the chest was performed using the standard protocol during bolus administration of intravenous contrast. Multiplanar CT image reconstructions and MIPs were obtained to evaluate the vascular anatomy. CONTRAST:  OMNIPAQUE IOHEXOL 350 MG/ML SOLN COMPARISON:  04/21/2019 FINDINGS: Cardiovascular: Satisfactory opacification of the pulmonary arteries to the segmental level. No evidence of pulmonary embolism. Thoracic aorta is nonaneurysmal. Three vessel arch. Normal heart size. No pericardial effusion. Mediastinum/Nodes: No enlarged mediastinal, hilar, or axillary lymph nodes. Thyroid gland, trachea, and esophagus demonstrate no significant findings. Lungs/Pleura: Lungs are clear. No pleural effusion or pneumothorax. Upper Abdomen: Diffusely decreased attenuation of the hepatic parenchyma compatible with hepatic  steatosis. No acute findings within the visualized upper abdomen. Musculoskeletal: No chest wall abnormality. No acute or significant osseous findings. Review of the MIP images confirms the above findings. IMPRESSION: 1. Negative for pulmonary embolism or other acute intrathoracic process. 2. Hepatic steatosis. Electronically Signed   By: Duanne Guess D.O.   On: 10/18/2020 16:40    Procedures Procedures   Medications Ordered in ED Medications  iohexol (OMNIPAQUE) 350 MG/ML injection 100 mL (100 mLs Intravenous Contrast Given 10/18/20 1557)    ED Course  I have reviewed the triage vital signs and the nursing notes.  Pertinent labs &  imaging results that were available during my care of the patient were reviewed by me and considered in my medical decision making (see chart for details).    MDM Rules/Calculators/A&P                          Pt's CT scan is negative for PE.  His troponins from last night and today are nl.  Pt is stable for d/c.  He is encouraged to try to stop smoking and to watch his diet and exercise.  Return if worse.  Final Clinical Impression(s) / ED Diagnoses Final diagnoses:  Atypical chest pain  Tobacco abuse    Rx / DC Orders ED Discharge Orders    None       Jacalyn Lefevre, MD 10/18/20 [id number]

## 2020-10-18 NOTE — ED Notes (Signed)
Patient transported to CT 

## 2020-10-18 NOTE — ED Notes (Signed)
Patient handed labels to staff and said "you can take me out"

## 2020-10-18 NOTE — Discharge Instructions (Addendum)
Try to stop smoking. °

## 2020-10-23 ENCOUNTER — Emergency Department (HOSPITAL_BASED_OUTPATIENT_CLINIC_OR_DEPARTMENT_OTHER): Payer: Self-pay

## 2020-10-23 ENCOUNTER — Emergency Department (HOSPITAL_BASED_OUTPATIENT_CLINIC_OR_DEPARTMENT_OTHER)
Admission: EM | Admit: 2020-10-23 | Discharge: 2020-10-23 | Disposition: A | Discharge status: Home / Self Care | Payer: Self-pay | Attending: Emergency Medicine | Admitting: Emergency Medicine

## 2020-10-23 ENCOUNTER — Other Ambulatory Visit: Payer: Self-pay

## 2020-10-23 ENCOUNTER — Encounter (HOSPITAL_BASED_OUTPATIENT_CLINIC_OR_DEPARTMENT_OTHER): Payer: Self-pay | Admitting: Obstetrics and Gynecology

## 2020-10-23 DIAGNOSIS — Z7901 Long term (current) use of anticoagulants: Secondary | ICD-10-CM | POA: Insufficient documentation

## 2020-10-23 DIAGNOSIS — S7011XA Contusion of right thigh, initial encounter: Secondary | ICD-10-CM | POA: Insufficient documentation

## 2020-10-23 DIAGNOSIS — F17210 Nicotine dependence, cigarettes, uncomplicated: Secondary | ICD-10-CM | POA: Insufficient documentation

## 2020-10-23 DIAGNOSIS — M79604 Pain in right leg: Secondary | ICD-10-CM | POA: Insufficient documentation

## 2020-10-23 DIAGNOSIS — X58XXXA Exposure to other specified factors, initial encounter: Secondary | ICD-10-CM | POA: Insufficient documentation

## 2020-10-23 NOTE — ED Triage Notes (Signed)
Patient reports to the ER for right leg pain in upper thigh region. Patient has a noted bruise to back of thigh. 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

## 2020-10-23 NOTE — ED Provider Notes (Signed)
[facility] EMERGENCY DEPT Provider Note   CSN: [id number] Arrival date & time: 10/23/20  1837     History Chief Complaint  Patient presents with  . Leg Pain    Caleb Arias is a 45 y.o. male.  Reports he had a PE in 2020.  Was on anticoagulation for 1 year and then stopped.  Aware prior DVT history.  States that over the past couple weeks he has noted some intermittent pain in his right leg.  Pain seems to be worse over the backside of his thigh.  Also noted a slight bruise to the back of the thigh.  He does not recall any trauma.  Has been able to walk without difficulty.  HPI     Past Medical History:  Diagnosis Date  . Pulmonary emboli (HCC)   . Tobacco abuse     Patient Active Problem List   Diagnosis Date Noted  . GAD (generalized anxiety disorder) 11/05/2019  . Pulmonary embolism (HCC) 05/01/2019  . Atypical chest pain 05/01/2019  . IGT (impaired glucose tolerance) 04/22/2019  . Hyperlipidemia 04/22/2019  . Obesity (BMI 30.0-34.9) 03/11/2019  . Vitamin D deficiency 03/11/2019  . Tobacco abuse     History reviewed. No pertinent surgical history.     No family history on file.  Social History   Tobacco Use  . Smoking status: Current Every Day Smoker    Packs/day: 1.50    Types: Cigarettes  . Smokeless tobacco: Never Used  Vaping Use  . Vaping Use: Never used  Substance Use Topics  . Alcohol use: Yes  . Drug use: Never    Home Medications Prior to Admission medications   Medication Sig Start Date End Date Taking? Authorizing Provider  acetaminophen (TYLENOL) 500 MG tablet Take 500-1,000 mg by mouth every 6 (six) hours as needed for mild pain or headache.    [provider]  ELIQUIS 5 MG TABS tablet TAKE 1 TABLET BY MOUTH TWICE A DAY 02/23/20   Philip Aspen, Limmie Patricia, MD  fexofenadine (ALLEGRA) 180 MG tablet Take 1 tablet (180 mg total) by mouth daily. 03/15/20 10/03/20  Philip Aspen, Limmie Patricia, MD  omeprazole (PRILOSEC) 40  MG capsule Take 1 capsule (40 mg total) by mouth daily. 03/15/20   Philip Aspen, Limmie Patricia, MD  sertraline (ZOLOFT) 100 MG tablet TAKE 1 TABLET BY MOUTH EVERY DAY 06/13/20   Philip Aspen, Limmie Patricia, MD  terbinafine (LAMISIL) 1 % cream Apply topically 2 (two) times daily. 03/16/20   Philip Aspen, Limmie Patricia, MD    Allergies    Asa [aspirin]  Review of Systems   Review of Systems  Constitutional: Negative for chills and fever.  HENT: Negative for ear pain and sore throat.   Eyes: Negative for pain and visual disturbance.  Respiratory: Negative for cough and shortness of breath.   Cardiovascular: Negative for chest pain and palpitations.  Gastrointestinal: Negative for abdominal pain and vomiting.  Genitourinary: Negative for dysuria and hematuria.  Musculoskeletal: Positive for arthralgias and myalgias. Negative for back pain.  Skin: Negative for color change and rash.  Neurological: Negative for seizures and syncope.  All other systems reviewed and are negative.   Physical Exam Updated Vital Signs BP (!) 154/96 (BP Location: Right Arm)   Pulse 80   Temp 98.2 F (36.8 C) (Oral)   Resp 19   Ht 5\' 11"  (1.803 m)   Wt 118 kg   SpO2 99%   BMI 36.28 kg/m   Physical Exam Vitals  and nursing note reviewed.  Constitutional:      Appearance: He is well-developed.  HENT:     Head: Normocephalic and atraumatic.  Eyes:     Conjunctiva/sclera: Conjunctivae normal.  Cardiovascular:     Rate and Rhythm: Normal rate.     Pulses: Normal pulses.  Pulmonary:     Effort: Pulmonary effort is normal. No respiratory distress.  Musculoskeletal:     Cervical back: Neck supple.     Comments: RLE: Mild tenderness noted over the posterior thigh, no deformity noted, no edema, normal DP/PT pulses, normal sensation and motor intact in distal extremity  Skin:    General: Skin is warm and dry.  Neurological:     General: No focal deficit present.     Mental Status: He is alert.   Psychiatric:        Mood and Affect: Mood normal.        Behavior: Behavior normal.     ED Results / Procedures / Treatments   Labs (all labs ordered are listed, but only abnormal results are displayed) Labs Reviewed - No data to display  EKG None  Radiology US Venous Img Lower Right (DVT Study)  Result Date: 10/23/2020 CLINICAL DATA:  Posterior thigh pain for several days, history of recent long vehicle ride, initial encounter EXAM: RIGHT LOWER EXTREMITY VENOUS DOPPLER ULTRASOUND TECHNIQUE: Gray-scale sonography with graded compression, as well as color Doppler and duplex ultrasound were performed to evaluate the lower extremity deep venous systems from the level of the common femoral vein and including the common femoral, femoral, profunda femoral, popliteal and calf veins including the posterior tibial, peroneal and gastrocnemius veins when visible. The superficial great saphenous vein was also interrogated. Spectral Doppler was utilized to evaluate flow at rest and with distal augmentation maneuvers in the common femoral, femoral and popliteal veins. COMPARISON:  None. FINDINGS: Contralateral Common Femoral Vein: Respiratory phasicity is normal and symmetric with the symptomatic side. No evidence of thrombus. Normal compressibility. Common Femoral Vein: No evidence of thrombus. Normal compressibility, respiratory phasicity and response to augmentation. Saphenofemoral Junction: No evidence of thrombus. Normal compressibility and flow on color Doppler imaging. Profunda Femoral Vein: No evidence of thrombus. Normal compressibility and flow on color Doppler imaging. Femoral Vein: No evidence of thrombus. Normal compressibility, respiratory phasicity and response to augmentation. Popliteal Vein: No evidence of thrombus. Normal compressibility, respiratory phasicity and response to augmentation. Calf Veins: No evidence of thrombus. Normal compressibility and flow on color Doppler imaging.  Superficial Great Saphenous Vein: No evidence of thrombus. Normal compressibility. Venous Reflux:  None. Other Findings:  None. IMPRESSION: No evidence of deep venous thrombosis. Electronically Signed   By: Alcide Clever M.D.   On: 10/23/2020 20:00    Procedures Procedures   Medications Ordered in ED Medications - No data to display  ED Course  I have reviewed the triage vital signs and the nursing notes.  Pertinent labs & imaging results that were available during my care of the patient were reviewed by me and considered in my medical decision making (see chart for details).    MDM Rules/Calculators/A&P                         45 year old male presents to ER with concern for right leg pain.  On exam he is well-appearing, leg appears well.  Neurovascular intact.  Given prior history of PE, no longer on anticoagulation, check DVT study.  DVT study was negative.  Suspect  MSK strain.  Recommended follow-up with primary doctor.  Patient already has appointment to see primary for routine physical on Friday.  After the discussed management above, the patient was determined to be safe for discharge.  The patient was in agreement with this plan and all questions regarding their care were answered.  ED return precautions were discussed and the patient will return to the ED with any significant worsening of condition.  Final Clinical Impression(s) / ED Diagnoses Final diagnoses:  Right leg pain    Rx / DC Orders ED Discharge Orders    None       Milagros Loll, MD 10/23/20 2018

## 2020-10-23 NOTE — Discharge Instructions (Signed)
Follow-up with your primary doctor.  Take Tylenol or Motrin for pain control.  If you have significant worsening of your pain, swelling, redness or other new concerning symptom, return to ER for reassessment.

## 2020-10-28 ENCOUNTER — Encounter: Payer: Self-pay | Admitting: Internal Medicine

## 2022-01-18 ENCOUNTER — Encounter: Payer: Self-pay | Admitting: Cardiovascular Disease
